# Patient Record
Sex: Female | Born: 1970 | Race: Asian | Hispanic: No | Marital: Married | Smoking: Never smoker
Health system: Southern US, Community
[De-identification: ages and names within clinical notes are randomized; demographics above are authoritative.]

## PROBLEM LIST (undated history)

## (undated) DIAGNOSIS — T7840XA Allergy, unspecified, initial encounter: Secondary | ICD-10-CM

## (undated) DIAGNOSIS — E079 Disorder of thyroid, unspecified: Secondary | ICD-10-CM

## (undated) HISTORY — DX: Disorder of thyroid, unspecified: E07.9

## (undated) HISTORY — PX: TUBAL LIGATION: SHX77

## (undated) HISTORY — DX: Allergy, unspecified, initial encounter: T78.40XA

---

## 1990-12-19 HISTORY — PX: APPENDECTOMY: SHX54

## 2014-12-29 ENCOUNTER — Ambulatory Visit (INDEPENDENT_AMBULATORY_CARE_PROVIDER_SITE_OTHER): Payer: Managed Care, Other (non HMO) | Admitting: Medical

## 2014-12-29 ENCOUNTER — Encounter: Payer: Self-pay | Admitting: Medical

## 2014-12-29 VITALS — BP 107/73 | HR 80 | Temp 98.2°F | Ht 61.5 in | Wt 114.2 lb

## 2014-12-29 DIAGNOSIS — S93402A Sprain of unspecified ligament of left ankle, initial encounter: Secondary | ICD-10-CM

## 2014-12-29 DIAGNOSIS — J069 Acute upper respiratory infection, unspecified: Secondary | ICD-10-CM

## 2014-12-29 DIAGNOSIS — L509 Urticaria, unspecified: Secondary | ICD-10-CM

## 2014-12-29 DIAGNOSIS — E039 Hypothyroidism, unspecified: Secondary | ICD-10-CM | POA: Insufficient documentation

## 2014-12-29 DIAGNOSIS — S93409A Sprain of unspecified ligament of unspecified ankle, initial encounter: Secondary | ICD-10-CM | POA: Insufficient documentation

## 2014-12-29 LAB — T4, FREE: FREE T4: 1.01 ng/dL (ref 0.60–1.60)

## 2014-12-29 LAB — T3, FREE: T3 FREE: 2.5 pg/mL (ref 2.3–4.2)

## 2014-12-29 LAB — TSH: TSH: 0.86 u[IU]/mL (ref 0.35–4.50)

## 2014-12-29 MED ORDER — FLUTICASONE PROPIONATE 50 MCG/ACT NA SUSP: 2.0000 | Freq: Every day | NASAL | Status: DC

## 2014-12-29 MED ORDER — CEFDINIR 300 MG PO CAPS: 300.0000 mg | ORAL_CAPSULE | Freq: Two times a day (BID) | ORAL | Status: DC

## 2014-12-29 NOTE — Assessment & Plan Note (Signed)
For your urticaria follow allergist recommendations.

## 2014-12-29 NOTE — Assessment & Plan Note (Signed)
  Your have recent flare of allergies vs uri.  I will prescribe flonase and advise you continue zyrtec.  Your lt ear may be early infected. I am prescribing cefdinir if you have any recurrent then start.

## 2014-12-29 NOTE — Progress Notes (Signed)
Subjective:    Patient ID: Kirsten Ramirez, female    DOB: [birth year], 44 y.o.   MRN: [id number]  HPI   I have reviewed pt PMH, PSH, FH, Social History and Surgical History  Allergies- Pt states she has had testing done and brings in records. Not associated with angiodema per allergist report.  Skin testing was negative [facility]. History of spring and summer allergies. She saw Dr. Vista Minkobert Ross. He told her to take zyrtec.   No anaphylaxis per Dr Tenny Crawoss notes..  Dr. Tenny Crawoss advised use zyrtec and if needed after one week for any urticaria she could add claritin. Also Sarna orignial lotion if needed.  Pt has history of hypothyroid. Pt last test year.   Dad maybe colon cancer. She is not sure what type.  Pt used to work [employer]), No exercise, healthy diet, no smoke, rare alcohol. Married- 2 children.  Last 3 days nasal congested, pnd, and runny nose. See ros limited symptoms and she feels a lot better.  Rt ankle pain twisted it skiing. Pt states when climbs stairs has pain. This occurred 8 days ago. Level 1/10 pain. Hardly any pain.  She states almost forgets about pain now.  No itching skin/urticaria know.  LMP- mid December.  Pt last pap smear.(last time was negative).   Last mammogram- 2 years.        Review of Systems  Constitutional: Negative for fever, chills and fatigue.  HENT: Positive for congestion, ear pain, postnasal drip and rhinorrhea. Negative for drooling, mouth sores, nosebleeds, sinus pressure, sore throat and tinnitus.        Lt ear pain only yesterday but none today.  Eyes: Negative for pain.  Respiratory: Negative for cough, chest tightness, shortness of breath and wheezing.   Cardiovascular: Negative for chest pain and palpitations.  Gastrointestinal: Negative for abdominal pain, diarrhea, constipation, blood in stool and abdominal distention.  Musculoskeletal: Negative for back pain.       Minimal faint rt ankle pain.  Now was severe pain. Now 1/10 level pain. Able to walk up stairs now with no pain.  Neurological: Negative for dizziness, tremors, seizures, syncope, facial asymmetry, speech difficulty, weakness, light-headedness, numbness and headaches.       Objective:   Physical Exam   General  Mental Status - Alert. General Appearance - Well groomed. Not in acute distress.  Skin Rashes- No Rashes.  HEENT Head- Normal. Ear Auditory Canal - Left- Normal. Right - Normal.Tympanic Membrane- Rt side Normal. Lt side- Upper 1/3 moderate bright red. Remainder looks normal. Eye Sclera/Conjunctiva- Left- Normal. Right- Normal. Nose & Sinuses Nasal Mucosa- Left-  Boggy + Congested. Right-  Boggy + Congested. Mouth & Throat Lips: Upper Lip- Normal: no dryness, cracking, pallor, cyanosis, or vesicular eruption. Lower Lip-Normal: no dryness, cracking, pallor, cyanosis or vesicular eruption. Buccal Mucosa- Bilateral- No Aphthous ulcers. Oropharynx- No Discharge or Erythema. Tonsils: Characteristics- Bilateral- No Erythema or Congestion. Size/Enlargement- Bilateral- No enlargement. Discharge- bilateral-None.  Neck Neck- Supple. No Masses.   Chest and Lung Exam Auscultation: Breath Sounds:- even and unlabored, but bilateral upper lobe rhonchi.  Cardiovascular Auscultation:Rythm- Regular, rate and rhythm. Murmurs & Other Heart Sounds:Ausculatation of the heart reveal- No Murmurs.  Lymphatic Head & Neck General Head & Neck Lymphatics: Bilateral: Description- No Localized lymphadenopathy.  Rt ankle- no swelling, no warmth, no tenderness. Demonstrated good rom. Faint tender at tip of bottom of fibula/tip.         Assessment & Plan:

## 2014-12-29 NOTE — Progress Notes (Signed)
Pre visit review using our clinic review tool, if applicable. No additional management support is needed unless otherwise documented below in the visit note. 

## 2014-12-29 NOTE — Patient Instructions (Addendum)
   Your have recent flare of allergies vs uri.  I will prescribe flonase and advise you continue zyrtec.  Your ankle pain is now 1/10. Minima/faint pain. Xray offer was declined. I would recommend ace bandage compression/wrap until pain resolved completely.(low dose ibuprofen if needed   Your lt ear may be early infected. I am prescribing cefdinir if you have any recurrent then start.  Follow up in at for physical exam. Come in fasting and make early morning appointment. Please check for the availability as you leave. I ask that you make appointment at 8:30 am or 9:00 am.  We decided to do thyroid testing today at your request.

## 2014-12-29 NOTE — Assessment & Plan Note (Addendum)
Your ankle pain is now 1/10. Minima/faint pain. Xray offer was declined. I would recommend ace bandage compression/wrap until pain resolved completely.(low dose ibuprofen if needed)

## 2014-12-29 NOTE — Assessment & Plan Note (Signed)
Tsh, t3, t4 today at pt request. She wanted more than just tsh. She will continue current dose pending the results.

## 2015-01-02 ENCOUNTER — Encounter: Payer: Self-pay | Admitting: Medical

## 2015-01-02 ENCOUNTER — Telehealth: Payer: Self-pay | Admitting: Medical

## 2015-01-02 ENCOUNTER — Ambulatory Visit (INDEPENDENT_AMBULATORY_CARE_PROVIDER_SITE_OTHER): Payer: Managed Care, Other (non HMO) | Admitting: Medical

## 2015-01-02 VITALS — BP 115/74 | HR 73 | Temp 98.4°F | Ht 61.5 in | Wt 114.0 lb

## 2015-01-02 DIAGNOSIS — Z1239 Encounter for other screening for malignant neoplasm of breast: Secondary | ICD-10-CM

## 2015-01-02 DIAGNOSIS — E039 Hypothyroidism, unspecified: Secondary | ICD-10-CM | POA: Diagnosis not present

## 2015-01-02 DIAGNOSIS — Z0000 Encounter for general adult medical examination without abnormal findings: Secondary | ICD-10-CM

## 2015-01-02 DIAGNOSIS — Z0189 Encounter for other specified special examinations: Secondary | ICD-10-CM

## 2015-01-02 LAB — CBC WITH DIFFERENTIAL/PLATELET
BASOS ABS: 0 10*3/uL (ref 0.0–0.1)
Basophils Relative: 0.5 % (ref 0.0–3.0)
Eosinophils Absolute: 0.1 10*3/uL (ref 0.0–0.7)
Eosinophils Relative: 1 % (ref 0.0–5.0)
HCT: 38.6 % (ref 36.0–46.0)
Hemoglobin: 12.8 g/dL (ref 12.0–15.0)
LYMPHS PCT: 26.5 % (ref 12.0–46.0)
Lymphs Abs: 1.9 10*3/uL (ref 0.7–4.0)
MCHC: 33.1 g/dL (ref 30.0–36.0)
MCV: 85.6 fl (ref 78.0–100.0)
MONOS PCT: 4.9 % (ref 3.0–12.0)
Monocytes Absolute: 0.3 10*3/uL (ref 0.1–1.0)
Neutro Abs: 4.8 10*3/uL (ref 1.4–7.7)
Neutrophils Relative %: 67.1 % (ref 43.0–77.0)
PLATELETS: 287 10*3/uL (ref 150.0–400.0)
RBC: 4.51 Mil/uL (ref 3.87–5.11)
RDW: 12.6 % (ref 11.5–15.5)
WBC: 7.1 10*3/uL (ref 4.0–10.5)

## 2015-01-02 LAB — COMPREHENSIVE METABOLIC PANEL
ALT: 10 U/L (ref 0–35)
AST: 14 U/L (ref 0–37)
Albumin: 4.3 g/dL (ref 3.5–5.2)
Alkaline Phosphatase: 55 U/L (ref 39–117)
BILIRUBIN TOTAL: 0.6 mg/dL (ref 0.2–1.2)
BUN: 13 mg/dL (ref 6–23)
CALCIUM: 9.2 mg/dL (ref 8.4–10.5)
CHLORIDE: 105 meq/L (ref 96–112)
CO2: 30 meq/L (ref 19–32)
Creatinine, Ser: 0.64 mg/dL (ref 0.40–1.20)
GFR: 106.51 mL/min (ref >60.00)
GLUCOSE: 97 mg/dL (ref 70–99)
Potassium: 3.8 mEq/L (ref 3.5–5.1)
Sodium: 139 mEq/L (ref 135–145)
Total Protein: 7.5 g/dL (ref 6.0–8.3)

## 2015-01-02 LAB — LIPID PANEL
CHOLESTEROL: 140 mg/dL (ref 0–200)
HDL: 41.2 mg/dL (ref >39.00)
LDL CALC: 75 mg/dL (ref 0–99)
NonHDL: 98.8
TRIGLYCERIDES: 119 mg/dL (ref 0.0–149.0)
Total CHOL/HDL Ratio: 3
VLDL: 23.8 mg/dL (ref 0.0–40.0)

## 2015-01-02 NOTE — Patient Instructions (Addendum)
Please get screening labs for physical exam today. We gave you fluvaccine today. I put in mammogram order and our staff should be call you with appointnment.  Preventive Care for Adults A healthy lifestyle and preventive care can promote health and wellness. Preventive health guidelines for women include the following key practices.  A routine yearly physical is a good way to check with your health care provider about your health and preventive screening. It is a chance to share any concerns and updates on your health and to receive a thorough exam.  Visit your dentist for a routine exam and preventive care every 6 months. Brush your teeth twice a day and floss once a day. Good oral hygiene prevents tooth decay and gum disease.  The frequency of eye exams is based on your age, health, family medical history, use of contact lenses, and other factors. Follow your health care provider's recommendations for frequency of eye exams.  Eat a healthy diet. Foods like vegetables, fruits, whole grains, low-fat dairy products, and lean protein foods contain the nutrients you need without too many calories. Decrease your intake of foods high in solid fats, added sugars, and salt. Eat the right amount of calories for you.Get information about a proper diet from your health care provider, if necessary.  Regular physical exercise is one of the most important things you can do for your health. Most adults should get at least 150 minutes of moderate-intensity exercise (any activity that increases your heart rate and causes you to sweat) each week. In addition, most adults need muscle-strengthening exercises on 2 or more days a week.  Maintain a healthy weight. The body mass index (BMI) is a screening tool to identify possible weight problems. It provides an estimate of body fat based on height and weight. Your health care provider can find your BMI and can help you achieve or maintain a healthy weight.For adults 20  years and older:  A BMI below 18.5 is considered underweight.  A BMI of 18.5 to 24.9 is normal.  A BMI of 25 to 29.9 is considered overweight.  A BMI of 30 and above is considered obese.  Maintain normal blood lipids and cholesterol levels by exercising and minimizing your intake of saturated fat. Eat a balanced diet with plenty of fruit and vegetables. Blood tests for lipids and cholesterol should begin at age 31 and be repeated every 5 years. If your lipid or cholesterol levels are high, you are over 50, or you are at high risk for heart disease, you may need your cholesterol levels checked more frequently.Ongoing high lipid and cholesterol levels should be treated with medicines if diet and exercise are not working.  If you smoke, find out from your health care provider how to quit. If you do not use tobacco, do not start.  Lung cancer screening is recommended for adults aged 79-80 years who are at high risk for developing lung cancer because of a history of smoking. A yearly low-dose CT scan of the lungs is recommended for people who have at least a 30-pack-year history of smoking and are a current smoker or have quit within the past 15 years. A pack year of smoking is smoking an average of 1 pack of cigarettes a day for 1 year (for example: 1 pack a day for 30 years or 2 packs a day for 15 years). Yearly screening should continue until the smoker has stopped smoking for at least 15 years. Yearly screening should be stopped for  people who develop a health problem that would prevent them from having lung cancer treatment.  If you are pregnant, do not drink alcohol. If you are breastfeeding, be very cautious about drinking alcohol. If you are not pregnant and choose to drink alcohol, do not have more than 1 drink per day. One drink is considered to be 12 ounces (355 mL) of beer, 5 ounces (148 mL) of wine, or 1.5 ounces (44 mL) of liquor.  Avoid use of street drugs. Do not share needles with  anyone. Ask for help if you need support or instructions about stopping the use of drugs.  High blood pressure causes heart disease and increases the risk of stroke. Your blood pressure should be checked at least every 1 to 2 years. Ongoing high blood pressure should be treated with medicines if weight loss and exercise do not work.  If you are 42-18 years old, ask your health care provider if you should take aspirin to prevent strokes.  Diabetes screening involves taking a blood sample to check your fasting blood sugar level. This should be done once every 3 years, after age 41, if you are within normal weight and without risk factors for diabetes. Testing should be considered at a younger age or be carried out more frequently if you are overweight and have at least 1 risk factor for diabetes.  Breast cancer screening is essential preventive care for women. You should practice "breast self-awareness." This means understanding the normal appearance and feel of your breasts and may include breast self-examination. Any changes detected, no matter how small, should be reported to a health care provider. Women in their 6s and 30s should have a clinical breast exam (CBE) by a health care provider as part of a regular health exam every 1 to 3 years. After age 54, women should have a CBE every year. Starting at age 55, women should consider having a mammogram (breast X-ray test) every year. Women who have a family history of breast cancer should talk to their health care provider about genetic screening. Women at a high risk of breast cancer should talk to their health care providers about having an MRI and a mammogram every year.  Breast cancer gene (BRCA)-related cancer risk assessment is recommended for women who have family members with BRCA-related cancers. BRCA-related cancers include breast, ovarian, tubal, and peritoneal cancers. Having family members with these cancers may be associated with an  increased risk for harmful changes (mutations) in the breast cancer genes BRCA1 and BRCA2. Results of the assessment will determine the need for genetic counseling and BRCA1 and BRCA2 testing.  Routine pelvic exams to screen for cancer are no longer recommended for nonpregnant women who are considered low risk for cancer of the pelvic organs (ovaries, uterus, and vagina) and who do not have symptoms. Ask your health care provider if a screening pelvic exam is right for you.  If you have had past treatment for cervical cancer or a condition that could lead to cancer, you need Pap tests and screening for cancer for at least 20 years after your treatment. If Pap tests have been discontinued, your risk factors (such as having a new sexual partner) need to be reassessed to determine if screening should be resumed. Some women have medical problems that increase the chance of getting cervical cancer. In these cases, your health care provider may recommend more frequent screening and Pap tests.  The HPV test is an additional test that may be used for  cervical cancer screening. The HPV test looks for the virus that can cause the cell changes on the cervix. The cells collected during the Pap test can be tested for HPV. The HPV test could be used to screen women aged 27 years and older, and should be used in women of any age who have unclear Pap test results. After the age of 55, women should have HPV testing at the same frequency as a Pap test.  Colorectal cancer can be detected and often prevented. Most routine colorectal cancer screening begins at the age of 79 years and continues through age 47 years. However, your health care provider may recommend screening at an earlier age if you have risk factors for colon cancer. On a yearly basis, your health care provider may provide home test kits to check for hidden blood in the stool. Use of a small camera at the end of a tube, to directly examine the colon  (sigmoidoscopy or colonoscopy), can detect the earliest forms of colorectal cancer. Talk to your health care provider about this at age 34, when routine screening begins. Direct exam of the colon should be repeated every 5-10 years through age 47 years, unless early forms of pre-cancerous polyps or small growths are found.  People who are at an increased risk for hepatitis B should be screened for this virus. You are considered at high risk for hepatitis B if:  You were born in a country where hepatitis B occurs often. Talk with your health care provider about which countries are considered high risk.  Your parents were born in a high-risk country and you have not received a shot to protect against hepatitis B (hepatitis B vaccine).  You have HIV or AIDS.  You use needles to inject street drugs.  You live with, or have sex with, someone who has hepatitis B.  You get hemodialysis treatment.  You take certain medicines for conditions like cancer, organ transplantation, and autoimmune conditions.  Hepatitis C blood testing is recommended for all people born from [birth year] through 1965 and any individual with known risks for hepatitis C.  Practice safe sex. Use condoms and avoid high-risk sexual practices to reduce the spread of sexually transmitted infections (STIs). STIs include gonorrhea, chlamydia, syphilis, trichomonas, herpes, HPV, and human immunodeficiency virus (HIV). Herpes, HIV, and HPV are viral illnesses that have no cure. They can result in disability, cancer, and death.  You should be screened for sexually transmitted illnesses (STIs) including gonorrhea and chlamydia if:  You are sexually active and are younger than 24 years.  You are older than 24 years and your health care provider tells you that you are at risk for this type of infection.  Your sexual activity has changed since you were last screened and you are at an increased risk for chlamydia or gonorrhea. Ask your health  care provider if you are at risk.  If you are at risk of being infected with HIV, it is recommended that you take a prescription medicine daily to prevent HIV infection. This is called preexposure prophylaxis (PrEP). You are considered at risk if:  You are a heterosexual woman, are sexually active, and are at increased risk for HIV infection.  You take drugs by injection.  You are sexually active with a partner who has HIV.  Talk with your health care provider about whether you are at high risk of being infected with HIV. If you choose to begin PrEP, you should first be tested for HIV. You  should then be tested every 3 months for as long as you are taking PrEP.  Osteoporosis is a disease in which the bones lose minerals and strength with aging. This can result in serious bone fractures or breaks. The risk of osteoporosis can be identified using a bone density scan. Women ages 77 years and over and women at risk for fractures or osteoporosis should discuss screening with their health care providers. Ask your health care provider whether you should take a calcium supplement or vitamin D to reduce the rate of osteoporosis.  Menopause can be associated with physical symptoms and risks. Hormone replacement therapy is available to decrease symptoms and risks. You should talk to your health care provider about whether hormone replacement therapy is right for you.  Use sunscreen. Apply sunscreen liberally and repeatedly throughout the day. You should seek shade when your shadow is shorter than you. Protect yourself by wearing long sleeves, pants, a wide-brimmed hat, and sunglasses year round, whenever you are outdoors.  Once a month, do a whole body skin exam, using a mirror to look at the skin on your back. Tell your health care provider of new moles, moles that have irregular borders, moles that are larger than a pencil eraser, or moles that have changed in shape or color.  Stay current with required  vaccines (immunizations).  Influenza vaccine. All adults should be immunized every year.  Tetanus, diphtheria, and acellular pertussis (Td, Tdap) vaccine. Pregnant women should receive 1 dose of Tdap vaccine during each pregnancy. The dose should be obtained regardless of the length of time since the last dose. Immunization is preferred during the 27th-36th week of gestation. An adult who has not previously received Tdap or who does not know her vaccine status should receive 1 dose of Tdap. This initial dose should be followed by tetanus and diphtheria toxoids (Td) booster doses every 10 years. Adults with an unknown or incomplete history of completing a 3-dose immunization series with Td-containing vaccines should begin or complete a primary immunization series including a Tdap dose. Adults should receive a Td booster every 10 years.  Varicella vaccine. An adult without evidence of immunity to varicella should receive 2 doses or a second dose if she has previously received 1 dose. Pregnant females who do not have evidence of immunity should receive the first dose after pregnancy. This first dose should be obtained before leaving the health care facility. The second dose should be obtained 4-8 weeks after the first dose.  Human papillomavirus (HPV) vaccine. Females aged 13-26 years who have not received the vaccine previously should obtain the 3-dose series. The vaccine is not recommended for use in pregnant females. However, pregnancy testing is not needed before receiving a dose. If a female is found to be pregnant after receiving a dose, no treatment is needed. In that case, the remaining doses should be delayed until after the pregnancy. Immunization is recommended for any person with an immunocompromised condition through the age of 4 years if she did not get any or all doses earlier. During the 3-dose series, the second dose should be obtained 4-8 weeks after the first dose. The third dose should be  obtained 24 weeks after the first dose and 16 weeks after the second dose.  Zoster vaccine. One dose is recommended for adults aged 73 years or older unless certain conditions are present.  Measles, mumps, and rubella (MMR) vaccine. Adults born before [birth year] generally are considered immune to measles and mumps. Adults born in  1959 or later should have 1 or more doses of MMR vaccine unless there is a contraindication to the vaccine or there is laboratory evidence of immunity to each of the three diseases. A routine second dose of MMR vaccine should be obtained at least 28 days after the first dose for students attending postsecondary schools, health care workers, or international travelers. People who received inactivated measles vaccine or an unknown type of measles vaccine during 1963-1967 should receive 2 doses of MMR vaccine. People who received inactivated mumps vaccine or an unknown type of mumps vaccine before 1979 and are at high risk for mumps infection should consider immunization with 2 doses of MMR vaccine. For females of childbearing age, rubella immunity should be determined. If there is no evidence of immunity, females who are not pregnant should be vaccinated. If there is no evidence of immunity, females who are pregnant should delay immunization until after pregnancy. Unvaccinated health care workers born before [birth year] who lack laboratory evidence of measles, mumps, or rubella immunity or laboratory confirmation of disease should consider measles and mumps immunization with 2 doses of MMR vaccine or rubella immunization with 1 dose of MMR vaccine.  Pneumococcal 13-valent conjugate (PCV13) vaccine. When indicated, a person who is uncertain of her immunization history and has no record of immunization should receive the PCV13 vaccine. An adult aged 21 years or older who has certain medical conditions and has not been previously immunized should receive 1 dose of PCV13 vaccine. This PCV13 should be  followed with a dose of pneumococcal polysaccharide (PPSV23) vaccine. The PPSV23 vaccine dose should be obtained at least 8 weeks after the dose of PCV13 vaccine. An adult aged 80 years or older who has certain medical conditions and previously received 1 or more doses of PPSV23 vaccine should receive 1 dose of PCV13. The PCV13 vaccine dose should be obtained 1 or more years after the last PPSV23 vaccine dose.  Pneumococcal polysaccharide (PPSV23) vaccine. When PCV13 is also indicated, PCV13 should be obtained first. All adults aged 46 years and older should be immunized. An adult younger than age 78 years who has certain medical conditions should be immunized. Any person who resides in a nursing home or long-term care facility should be immunized. An adult smoker should be immunized. People with an immunocompromised condition and certain other conditions should receive both PCV13 and PPSV23 vaccines. People with human immunodeficiency virus (HIV) infection should be immunized as soon as possible after diagnosis. Immunization during chemotherapy or radiation therapy should be avoided. Routine use of PPSV23 vaccine is not recommended for American Indians, [employer] Natives, or people younger than 65 years unless there are medical conditions that require PPSV23 vaccine. When indicated, people who have unknown immunization and have no record of immunization should receive PPSV23 vaccine. One-time revaccination 5 years after the first dose of PPSV23 is recommended for people aged 19-64 years who have chronic kidney failure, nephrotic syndrome, asplenia, or immunocompromised conditions. People who received 1-2 doses of PPSV23 before age 51 years should receive another dose of PPSV23 vaccine at age 75 years or later if at least 5 years have passed since the previous dose. Doses of PPSV23 are not needed for people immunized with PPSV23 at or after age 61 years.  Meningococcal vaccine. Adults with asplenia or persistent  complement component deficiencies should receive 2 doses of quadrivalent meningococcal conjugate (MenACWY-D) vaccine. The doses should be obtained at least 2 months apart. Microbiologists working with certain meningococcal bacteria, [employer] recruits, people at risk  during an outbreak, and people who travel to or live in countries with a high rate of meningitis should be immunized. A first-year college student up through age 58 years who is living in a residence hall should receive a dose if she did not receive a dose on or after her 16th birthday. Adults who have certain high-risk conditions should receive one or more doses of vaccine.  Hepatitis A vaccine. Adults who wish to be protected from this disease, have certain high-risk conditions, work with hepatitis A-infected animals, work in hepatitis A research labs, or travel to or work in countries with a high rate of hepatitis A should be immunized. Adults who were previously unvaccinated and who anticipate close contact with an international adoptee during the first 60 days after arrival in the [place] from a country with a high rate of hepatitis A should be immunized.  Hepatitis B vaccine. Adults who wish to be protected from this disease, have certain high-risk conditions, may be exposed to blood or other infectious body fluids, are household contacts or sex partners of hepatitis B positive people, are clients or workers in certain care facilities, or travel to or work in countries with a high rate of hepatitis B should be immunized.  Haemophilus influenzae type b (Hib) vaccine. A previously unvaccinated person with asplenia or sickle cell disease or having a scheduled splenectomy should receive 1 dose of Hib vaccine. Regardless of previous immunization, a recipient of a hematopoietic stem cell transplant should receive a 3-dose series 6-12 months after her successful transplant. Hib vaccine is not recommended for adults with HIV  infection. Preventive Services / Frequency Ages 68 to 27 years  Blood pressure check.** / Every 1 to 2 years.  Lipid and cholesterol check.** / Every 5 years beginning at age 26.  Clinical breast exam.** / Every 3 years for women in their 49s and 21s.  BRCA-related cancer risk assessment.** / For women who have family members with a BRCA-related cancer (breast, ovarian, tubal, or peritoneal cancers).  Pap test.** / Every 2 years from ages 7 through 20. Every 3 years starting at age 1 through age 88 or 31 with a history of 3 consecutive normal Pap tests.  HPV screening.** / Every 3 years from ages 39 through ages 49 to 33 with a history of 3 consecutive normal Pap tests.  Hepatitis C blood test.** / For any individual with known risks for hepatitis C.  Skin self-exam. / Monthly.  Influenza vaccine. / Every year.  Tetanus, diphtheria, and acellular pertussis (Tdap, Td) vaccine.** / Consult your health care provider. Pregnant women should receive 1 dose of Tdap vaccine during each pregnancy. 1 dose of Td every 10 years.  Varicella vaccine.** / Consult your health care provider. Pregnant females who do not have evidence of immunity should receive the first dose after pregnancy.  HPV vaccine. / 3 doses over 6 months, if 60 and younger. The vaccine is not recommended for use in pregnant females. However, pregnancy testing is not needed before receiving a dose.  Measles, mumps, rubella (MMR) vaccine.** / You need at least 1 dose of MMR if you were born in 1957 or later. You may also need a 2nd dose. For females of childbearing age, rubella immunity should be determined. If there is no evidence of immunity, females who are not pregnant should be vaccinated. If there is no evidence of immunity, females who are pregnant should delay immunization until after pregnancy.  Pneumococcal 13-valent conjugate (PCV13) vaccine.** /  Consult your health care provider.  Pneumococcal polysaccharide  (PPSV23) vaccine.** / 1 to 2 doses if you smoke cigarettes or if you have certain conditions.  Meningococcal vaccine.** / 1 dose if you are age 85 to 57 years and a Market researcher living in a residence hall, or have one of several medical conditions, you need to get vaccinated against meningococcal disease. You may also need additional booster doses.  Hepatitis A vaccine.** / Consult your health care provider.  Hepatitis B vaccine.** / Consult your health care provider.  Haemophilus influenzae type b (Hib) vaccine.** / Consult your health care provider. Ages 22 to 33 years  Blood pressure check.** / Every 1 to 2 years.  Lipid and cholesterol check.** / Every 5 years beginning at age 47 years.  Lung cancer screening. / Every year if you are aged 27-80 years and have a 30-pack-year history of smoking and currently smoke or have quit within the past 15 years. Yearly screening is stopped once you have quit smoking for at least 15 years or develop a health problem that would prevent you from having lung cancer treatment.  Clinical breast exam.** / Every year after age 60 years.  BRCA-related cancer risk assessment.** / For women who have family members with a BRCA-related cancer (breast, ovarian, tubal, or peritoneal cancers).  Mammogram.** / Every year beginning at age 41 years and continuing for as long as you are in good health. Consult with your health care provider.  Pap test.** / Every 3 years starting at age 24 years through age 71 or 27 years with a history of 3 consecutive normal Pap tests.  HPV screening.** / Every 3 years from ages 4 years through ages 85 to 59 years with a history of 3 consecutive normal Pap tests.  Fecal occult blood test (FOBT) of stool. / Every year beginning at age 13 years and continuing until age 47 years. You may not need to do this test if you get a colonoscopy every 10 years.  Flexible sigmoidoscopy or colonoscopy.** / Every 5 years for a  flexible sigmoidoscopy or every 10 years for a colonoscopy beginning at age 3 years and continuing until age 38 years.  Hepatitis C blood test.** / For all people born from [birth year] through 1965 and any individual with known risks for hepatitis C.  Skin self-exam. / Monthly.  Influenza vaccine. / Every year.  Tetanus, diphtheria, and acellular pertussis (Tdap/Td) vaccine.** / Consult your health care provider. Pregnant women should receive 1 dose of Tdap vaccine during each pregnancy. 1 dose of Td every 10 years.  Varicella vaccine.** / Consult your health care provider. Pregnant females who do not have evidence of immunity should receive the first dose after pregnancy.  Zoster vaccine.** / 1 dose for adults aged 22 years or older.  Measles, mumps, rubella (MMR) vaccine.** / You need at least 1 dose of MMR if you were born in 1957 or later. You may also need a 2nd dose. For females of childbearing age, rubella immunity should be determined. If there is no evidence of immunity, females who are not pregnant should be vaccinated. If there is no evidence of immunity, females who are pregnant should delay immunization until after pregnancy.  Pneumococcal 13-valent conjugate (PCV13) vaccine.** / Consult your health care provider.  Pneumococcal polysaccharide (PPSV23) vaccine.** / 1 to 2 doses if you smoke cigarettes or if you have certain conditions.  Meningococcal vaccine.** / Consult your health care provider.  Hepatitis A vaccine.** / Consult  your health care provider.  Hepatitis B vaccine.** / Consult your health care provider.  Haemophilus influenzae type b (Hib) vaccine.** / Consult your health care provider. Ages 76 years and over  Blood pressure check.** / Every 1 to 2 years.  Lipid and cholesterol check.** / Every 5 years beginning at age 62 years.  Lung cancer screening. / Every year if you are aged 34-80 years and have a 30-pack-year history of smoking and currently smoke or have  quit within the past 15 years. Yearly screening is stopped once you have quit smoking for at least 15 years or develop a health problem that would prevent you from having lung cancer treatment.  Clinical breast exam.** / Every year after age 12 years.  BRCA-related cancer risk assessment.** / For women who have family members with a BRCA-related cancer (breast, ovarian, tubal, or peritoneal cancers).  Mammogram.** / Every year beginning at age 65 years and continuing for as long as you are in good health. Consult with your health care provider.  Pap test.** / Every 3 years starting at age 75 years through age 39 or 25 years with 3 consecutive normal Pap tests. Testing can be stopped between 65 and 70 years with 3 consecutive normal Pap tests and no abnormal Pap or HPV tests in the past 10 years.  HPV screening.** / Every 3 years from ages 12 years through ages 52 or 87 years with a history of 3 consecutive normal Pap tests. Testing can be stopped between 65 and 70 years with 3 consecutive normal Pap tests and no abnormal Pap or HPV tests in the past 10 years.  Fecal occult blood test (FOBT) of stool. / Every year beginning at age 93 years and continuing until age 49 years. You may not need to do this test if you get a colonoscopy every 10 years.  Flexible sigmoidoscopy or colonoscopy.** / Every 5 years for a flexible sigmoidoscopy or every 10 years for a colonoscopy beginning at age 70 years and continuing until age 4 years.  Hepatitis C blood test.** / For all people born from [birth year] through 1967 and any individual with known risks for hepatitis C.  Osteoporosis screening.** / A one-time screening for women ages 10 years and over and women at risk for fractures or osteoporosis.  Skin self-exam. / Monthly.  Influenza vaccine. / Every year.  Tetanus, diphtheria, and acellular pertussis (Tdap/Td) vaccine.** / 1 dose of Td every 10 years.  Varicella vaccine.** / Consult your health care  provider.  Zoster vaccine.** / 1 dose for adults aged 38 years or older.  Pneumococcal 13-valent conjugate (PCV13) vaccine.** / Consult your health care provider.  Pneumococcal polysaccharide (PPSV23) vaccine.** / 1 dose for all adults aged 44 years and older.  Meningococcal vaccine.** / Consult your health care provider.  Hepatitis A vaccine.** / Consult your health care provider.  Hepatitis B vaccine.** / Consult your health care provider.  Haemophilus influenzae type b (Hib) vaccine.** / Consult your health care provider. ** Family history and personal history of risk and conditions may change your health care provider's recommendations. Document Released: 01/31/2002 Document Revised: 04/21/2014 Document Reviewed: 05/02/2011 [facility] Patient Information 2015 [place], [place]. This information is not intended to replace advice given to you by your health care provider. Make sure you discuss any questions you have with your health care provider.

## 2015-01-02 NOTE — Progress Notes (Signed)
Subjective:    Patient ID: Kirsten Ramirez, female    DOB: 29-Jul-1971, 44 y.o.   MRN: [id number]  HPI   Pt in for physical. She had papsmear January of last year. Normal pap.   Pt had mammogram either Jan 15th, 2014 and she thinks was normal. [facility]  Pt does not exercise, healthy diet, no smoker, rare alchohol. Married 2 children.  No flu vaccine this year.  Pt initially not sure when her last Tdap was? We don't have all her records yet. Pt called office and only got tetanus. Jan, 4 2014.  No acute problems today. Or flare of chronic problems.  Lmp- currently.  Past Medical History  Diagnosis Date  . Allergy   . Thyroid disease     History   Social History  . Marital Status: Married    Spouse Name: N/A    Number of Children: N/A  . Years of Education: N/A   Occupational History  . Not on file.   Social History Main Topics  . Smoking status: Never Smoker   . Smokeless tobacco: Never Used  . Alcohol Use: 0.0 oz/week    0 Not specified per week     Comment: only very rare and very little around holidays.  . Drug Use: No  . Sexual Activity: Yes   Other Topics Concern  . Not on file   Social History Narrative    Past Surgical History  Procedure Laterality Date  . Appendectomy    . Cesarean section      x 2    Family History  Problem Relation Age of Onset  . Hypertension Mother   . Hypertension Father   . Cancer Father     No Known Allergies  Current Outpatient Prescriptions on File Prior to Visit  Medication Sig Dispense Refill  . fluticasone (FLONASE) 50 MCG/ACT nasal spray Place 2 sprays into both nostrils daily. 16 g 1  . levothyroxine (SYNTHROID, LEVOTHROID) 50 MCG tablet Take 50 mcg by mouth daily before breakfast.    . cefdinir (OMNICEF) 300 MG capsule Take 1 capsule (300 mg total) by mouth 2 (two) times daily. (Patient not taking: Reported on 01/02/2015) 20 capsule 0   No current  facility-administered medications on file prior to visit.    BP 115/74 mmHg  Pulse 73  Temp(Src) 98.4 F (36.9 C) (Oral)  Ht 5' 1.5" (1.562 m)  Wt 114 lb (51.71 kg)  BMI 21.19 kg/m2  SpO2 100%  LMP 12/10/2014      Review of Systems  Constitutional: Negative for fever, chills and fatigue.  HENT: Positive for congestion. Negative for ear discharge, ear pain, nosebleeds, postnasal drip, rhinorrhea, sinus pressure, sore throat and trouble swallowing.        Faint nasal congestion only.  Respiratory: Negative for cough, chest tightness, shortness of breath and wheezing.   Cardiovascular: Negative for chest pain and palpitations.  Gastrointestinal: Negative for nausea, vomiting, abdominal pain, diarrhea and constipation.  Genitourinary: Negative for dysuria and flank pain.  Musculoskeletal: Negative for back pain.  Neurological: Negative for dizziness, tremors, seizures, syncope, weakness, light-headedness, numbness and headaches.  Hematological: Negative for adenopathy. Does not bruise/bleed easily.  Psychiatric/Behavioral: Negative for suicidal ideas, behavioral problems and dysphoric mood. The patient is not nervous/anxious.        Objective:   Physical Exam   General   Mental Status- Alert.  Orientation-Oriented x3. Build and Nutrition Well Nourished and Well Developed.  Skin General:  Normal.  Color- Normal color. Moisture- Dry.Temperature warm. Lesions: No suspicious lesions  Head, Eyes, Ears, Nose, Thoat Ears-Normal. Auditory Canal-Bilateral-Normal. Tympanic Membrane- Bilateral-Normal. Eyes Fundi- Bilateral-Normal. Pupil- Bilateral- Direct reaction to light normal. Nose & Sinuses- Normal. Nostril- Bilateral-Normal.  Neck Neck- No Bruits or Masses. Thyroid- Normal. No thyromegaly or nodules.  Breast Breast Lump: No palpable masses, symmetric, no axillary lymphadenopathy palpated.  Chest and Lung Exam  Percussion: Quality and Intensity:-Percussion normal.  Percussion of chest reveals- No Dullness. Palpation of the chest reveals- Non-tender. Auscultation: Breath sounds-Normal. Adventitious  Sounds:No adventitious   Vaginal Pap neg last year. Currently menstruating. Exam not done.   Cardiovascular Inspection: No Heaves. Auscultation: Heart Sounds- Normal sinus rhythm without murmur or gallop, S1 WNL and S2 WNL.  Abdomen Inspection:- Inspection Normal. Inspection of abdomen reveals- No Hernias. Palpation/Percussion: Palpation and Percussion of the abdomen reveal- Non Tender and No Palpable masses. Liver: Other Characteristics- No Hepatmegaly Spleen:Other Characteristics- No Splenomegaly. Auscultation: Auscultation of the abdomen reveals-Bowel sounds normal and No Abdominal bruits.   Neurologic Mental Status- Normal Cranial Nerves- Normal Bilaterally, Motor- Normal. Strength:5/5 normal muscle strength- All Muscles. General Assessment of Reflexes- Right Knee- 2+. Left Knee- 2+. Coordination- Normal. Gait- Normal. Meningeal Signs- None.  Musculoskeletal Global Assessment General- Joints show full range of motion without obvious deformity and Normal muscle mass. Strength 5/5 in upper and lower extremities.  Lymphatic General lymphatics Description-No Generalized lymphadenopathy.           Assessment & Plan:  Pt after everything done on exam and when she was outside lab she asks me what vaccinations she needs for [place] travel. I had to get back with other patients. I advised her to contact health dept travel clinic. Give them location of travel and they can advise her.

## 2015-01-02 NOTE — Telephone Encounter (Signed)
Caller name: Sabrina Relation to pt: self Call back number:  [phone] Pharmacy:  Reason for call:   Patient states that the provider that she switched from usually did a urine test with the cpe labs and wanted to know why a urine test wasn't ordered.

## 2015-01-02 NOTE — Assessment & Plan Note (Signed)
Fasting cbc, cmp, tsh, lipid panel today. Fluvaccine given.

## 2015-01-02 NOTE — Progress Notes (Signed)
Pre visit review using our clinic review tool, if applicable. No additional management support is needed unless otherwise documented below in the visit note. 

## 2015-01-05 NOTE — Telephone Encounter (Signed)
Not to lpn. Regarding ua on pe.

## 2015-01-06 NOTE — Telephone Encounter (Signed)
She can actually just have a lab appt.. I tried to call but there was no answer or voice mail

## 2015-01-07 ENCOUNTER — Other Ambulatory Visit: Payer: Self-pay

## 2015-01-07 ENCOUNTER — Telehealth: Payer: Self-pay | Admitting: Medical

## 2015-01-07 MED ORDER — LEVOTHYROXINE SODIUM 50 MCG PO TABS: 50.0000 ug | ORAL_TABLET | Freq: Every day | ORAL | Status: DC

## 2015-01-07 NOTE — Telephone Encounter (Signed)
Refilled medication 6 month supply only.

## 2015-01-07 NOTE — Telephone Encounter (Signed)
Caller name: Freada Relation to pt: self Call back number: [phone] [facility]  Reason for call:   Patient requesting levothyroxine refill to be sent. She is requesting a years worth.

## 2015-01-07 NOTE — Telephone Encounter (Signed)
Orders entered.  [facility]

## 2015-01-07 NOTE — Telephone Encounter (Signed)
Lab appointment scheduled for 01/08/15. Please order.

## 2015-01-08 ENCOUNTER — Other Ambulatory Visit (INDEPENDENT_AMBULATORY_CARE_PROVIDER_SITE_OTHER): Payer: Managed Care, Other (non HMO)

## 2015-01-08 ENCOUNTER — Telehealth: Payer: Self-pay | Admitting: Medical

## 2015-01-08 DIAGNOSIS — R319 Hematuria, unspecified: Secondary | ICD-10-CM

## 2015-01-08 DIAGNOSIS — Z0000 Encounter for general adult medical examination without abnormal findings: Secondary | ICD-10-CM

## 2015-01-08 DIAGNOSIS — Z0189 Encounter for other specified special examinations: Secondary | ICD-10-CM

## 2015-01-08 LAB — POCT URINALYSIS DIPSTICK
Bilirubin, UA: NEGATIVE
Glucose, UA: NEGATIVE
Ketones, UA: NEGATIVE
Leukocytes, UA: NEGATIVE
NITRITE UA: NEGATIVE
PH UA: 6
Protein, UA: NEGATIVE
SPEC GRAV UA: 1.02
Urobilinogen, UA: 0.2

## 2015-01-08 NOTE — Telephone Encounter (Signed)
Caller name:Janal Gikas Relationship to patient:self Can be reached: Pharmacy:  Reason for call:returning call

## 2015-01-08 NOTE — Telephone Encounter (Signed)
Patient returning phone call. Best # [phone]

## 2015-01-08 NOTE — Telephone Encounter (Signed)
Patient called back regarding urine and labs. Will come in on Monday to repeat urine only. Wants to call insurance to see if it will be covered.

## 2015-01-08 NOTE — Telephone Encounter (Signed)
Patient came in today for U/A. To be added to wellness exam.

## 2015-01-08 NOTE — Addendum Note (Signed)
Addended by: Verdie ShireBAYNES, Kenyatta Gloeckner M on: 01/08/2015 10:02 AM   Modules accepted: Orders

## 2015-01-09 LAB — URINE CULTURE
Colony Count: NO GROWTH
Organism ID, Bacteria: NO GROWTH

## 2015-01-13 ENCOUNTER — Telehealth: Payer: Self-pay | Admitting: *Deleted

## 2015-01-13 NOTE — Telephone Encounter (Signed)
[facility] JG//CMA

## 2015-02-13 ENCOUNTER — Telehealth: Payer: Self-pay | Admitting: Medical

## 2015-02-13 NOTE — Telephone Encounter (Signed)
PATIENT IS SCHEDULED FOR MAMMOGRAM ON 3-3  @1130  AT [facility] AND PATIENT IS AWARE

## 2015-02-13 NOTE — Telephone Encounter (Signed)
Can you please look into the referral for mammogram- PT states was told by Ramon DredgeEdward at her January appointment she needed to have one.

## 2015-03-12 ENCOUNTER — Telehealth: Payer: Self-pay | Admitting: Medical

## 2015-03-12 NOTE — Telephone Encounter (Signed)
Caller name: Cortez Relation to pt: self Call back number: [phone] Pharmacy:  Reason for call:   Patient states that DOS 12/29/14 and 01/02/15 were coded wrong. Patient went in for a cpe and was not coded as a wellness. Labs were coded as diagnostic not wellness.

## 2015-03-13 NOTE — Telephone Encounter (Signed)
Kirsten Ramirez,  This patient is saying her visits were coded wrong. I don't understand her complaint to be honest. She came on the 10711 th with some  acute complaints. On 1st visit I rarely do physical/wellness exam(If they expect one I tell them why I do not do so). I only do those if #1 no pmh of any sort, very young and without any acute complaint. On review she had various complaints on the 11th and I am sure I did not tell her this was wellness exam. She wanted her thryoid test done. So I did order that on 11th.   Then she came back later on 15th for physical/ wellness.(if I remember correctly she ws leaving out of country and wanted it done)   Pt sometimes thingkthey can be seen in first visit  and get wellness on first visit. Or maybe they  get that impression. Anyhow I don't know how to address this and I don't like to talk billing/finances with patient.   I would want to make sure that on 15th when I did see her for wellness that I coded correctly. And maybe you get coder to make sure that is the case. i believe I would have charged her established preventative type visit for her age and put in all labs under that diagnosis or screening diagnosis.

## 2015-03-24 ENCOUNTER — Encounter: Payer: Self-pay | Admitting: Family Medicine

## 2015-04-27 ENCOUNTER — Encounter: Payer: Self-pay | Admitting: Family

## 2015-04-27 ENCOUNTER — Ambulatory Visit (INDEPENDENT_AMBULATORY_CARE_PROVIDER_SITE_OTHER): Payer: Managed Care, Other (non HMO) | Admitting: Family

## 2015-04-27 VITALS — BP 100/64 | HR 70 | Temp 98.3°F | Resp 16 | Ht 61.5 in | Wt 112.2 lb

## 2015-04-27 DIAGNOSIS — J020 Streptococcal pharyngitis: Secondary | ICD-10-CM

## 2015-04-27 DIAGNOSIS — J029 Acute pharyngitis, unspecified: Secondary | ICD-10-CM | POA: Diagnosis not present

## 2015-04-27 DIAGNOSIS — N926 Irregular menstruation, unspecified: Secondary | ICD-10-CM

## 2015-04-27 LAB — POCT RAPID STREP A: Rapid Strep: POSITIVE

## 2015-04-27 MED ORDER — AMOXICILLIN 500 MG PO CAPS: 500.0000 mg | ORAL_CAPSULE | Freq: Three times a day (TID) | ORAL | Status: DC

## 2015-04-27 NOTE — Progress Notes (Signed)
   Subjective:    Patient ID: Kirsten Ramirez, female    DOB: 07-31-1971, 44 y.o.   MRN: [id number]  HPI  Ms. Kirsten Ramirez is a 44 yr old female who presents today with complaint of sore throat. Symptoms started 1 week. Denies associated fever. + associated cough.  Notes that her left eyelid was swollen yesterday. Reports some mild right ear pain. Throat pain is worse on the left.    Review of Systems    see HPI  Past Medical History  Diagnosis Date  . Allergy   . Thyroid disease     History   Social History  . Marital Status: Married    Spouse Name: N/A  . Number of Children: N/A  . Years of Education: N/A   Occupational History  . Not on file.   Social History Main Topics  . Smoking status: Never Smoker   . Smokeless tobacco: Never Used  . Alcohol Use: 0.0 oz/week    0 Standard drinks or equivalent per week     Comment: only very rare and very little around holidays.  . Drug Use: No  . Sexual Activity: Yes   Other Topics Concern  . Not on file   Social History Narrative    Past Surgical History  Procedure Laterality Date  . Appendectomy    . Cesarean section      x 2    Family History  Problem Relation Age of Onset  . Hypertension Mother   . Hypertension Father   . Cancer Father     No Known Allergies  Current Outpatient Prescriptions on File Prior to Visit  Medication Sig Dispense Refill  . fluticasone (FLONASE) 50 MCG/ACT nasal spray Place 2 sprays into both nostrils daily. 16 g 1  . levothyroxine (SYNTHROID, LEVOTHROID) 50 MCG tablet Take 1 tablet (50 mcg total) by mouth daily before breakfast. 180 tablet 0   No current facility-administered medications on file prior to visit.    BP 100/64 mmHg  Pulse 70  Temp(Src) 98.3 F (36.8 C) (Oral)  Resp 16  Ht 5' 1.5" (1.562 m)  Wt 112 lb 3.2 oz (50.894 kg)  BMI 20.86 kg/m2  SpO2 99%  LMP 04/10/2015    Objective:   Physical Exam  Constitutional: She is oriented to person, place, and time. She appears  well-developed and well-nourished. No distress.  HENT:  Head: Normocephalic and atraumatic.  Right Ear: Tympanic membrane and ear canal normal.  Left Ear: Tympanic membrane and ear canal normal.  Mouth/Throat: Posterior oropharyngeal erythema present. No oropharyngeal exudate or posterior oropharyngeal edema.  No eyelid swelling noted today  Cardiovascular: Normal rate and regular rhythm.   No murmur heard. Pulmonary/Chest: Effort normal and breath sounds normal. No respiratory distress. She has no wheezes. She has no rales. She exhibits no tenderness.  Lymphadenopathy:    She has no cervical adenopathy.  Neurological: She is alert and oriented to person, place, and time.  Psychiatric: She has a normal mood and affect. Her behavior is normal. Judgment and thought content normal.          Assessment & Plan:

## 2015-04-27 NOTE — Patient Instructions (Signed)
Start amoxicillin for strep throat. You will be contacted about your referral to OB/GYN.    Strep Throat Strep throat is an infection of the throat caused by a bacteria named Streptococcus pyogenes. Your health care provider may call the infection streptococcal "tonsillitis" or "pharyngitis" depending on whether there are signs of inflammation in the tonsils or back of the throat. Strep throat is most common in children aged 46-15 years during the cold months of the year, but it can occur in people of any age during any season. This infection is spread from person to person (contagious) through coughing, sneezing, or other close contact. SIGNS AND SYMPTOMS   Fever or chills.  Painful, swollen, red tonsils or throat.  Pain or difficulty when swallowing.  White or yellow spots on the tonsils or throat.  Swollen, tender lymph nodes or "glands" of the neck or under the jaw.  Red rash all over the body (rare). DIAGNOSIS  Many different infections can cause the same symptoms. A test must be done to confirm the diagnosis so the right treatment can be given. A "rapid strep test" can help your health care provider make the diagnosis in a few minutes. If this test is not available, a light swab of the infected area can be used for a throat culture test. If a throat culture test is done, results are usually available in a day or two. TREATMENT  Strep throat is treated with antibiotic medicine. HOME CARE INSTRUCTIONS   Gargle with 1 tsp of salt in 1 cup of warm water, 3-4 times per day or as needed for comfort.  Family members who also have a sore throat or fever should be tested for strep throat and treated with antibiotics if they have the strep infection.  Make sure everyone in your household washes their hands well.  Do not share food, drinking cups, or personal items that could cause the infection to spread to others.  You may need to eat a soft food diet until your sore throat gets  better.  Drink enough water and fluids to keep your urine clear or pale yellow. This will help prevent dehydration.  Get plenty of rest.  Stay home from school, day care, or work until you have been on antibiotics for 24 hours.  Take medicines only as directed by your health care provider.  Take your antibiotic medicine as directed by your health care provider. Finish it even if you start to feel better. SEEK MEDICAL CARE IF:   The glands in your neck continue to enlarge.  You develop a rash, cough, or earache.  You cough up green, yellow-brown, or bloody sputum.  You have pain or discomfort not controlled by medicines.  Your problems seem to be getting worse rather than better.  You have a fever. SEEK IMMEDIATE MEDICAL CARE IF:   You develop any new symptoms such as vomiting, severe headache, stiff or painful neck, chest pain, shortness of breath, or trouble swallowing.  You develop severe throat pain, drooling, or changes in your voice.  You develop swelling of the neck, or the skin on the neck becomes red and tender.  You develop signs of dehydration, such as fatigue, dry mouth, and decreased urination.  You become increasingly sleepy, or you cannot wake up completely. MAKE SURE YOU:  Understand these instructions.  Will watch your condition.  Will get help right away if you are not doing well or get worse. Document Released: 12/02/2000 Document Revised: 04/21/2014 Document Reviewed: 02/03/2011 [employer]  Patient Information 2015 [employer]. This information is not intended to replace advice given to you by your health care provider. Make sure you discuss any questions you have with your health care provider.  

## 2015-04-27 NOTE — Progress Notes (Signed)
Pre visit review using our clinic review tool, if applicable. No additional management support is needed unless otherwise documented below in the visit note. 

## 2015-04-28 DIAGNOSIS — J020 Streptococcal pharyngitis: Secondary | ICD-10-CM | POA: Insufficient documentation

## 2015-04-28 NOTE — Assessment & Plan Note (Signed)
Rapid strep is +.  Will rx with amoxicillin.  

## 2015-04-30 ENCOUNTER — Encounter: Payer: Self-pay | Admitting: Medical

## 2015-05-20 ENCOUNTER — Encounter: Payer: Self-pay | Admitting: Physician Assistant

## 2015-05-20 ENCOUNTER — Ambulatory Visit (INDEPENDENT_AMBULATORY_CARE_PROVIDER_SITE_OTHER): Payer: Managed Care, Other (non HMO) | Admitting: Physician Assistant

## 2015-05-20 ENCOUNTER — Telehealth: Payer: Self-pay | Admitting: Medical

## 2015-05-20 VITALS — BP 100/66 | HR 64 | Temp 98.3°F | Ht 61.5 in | Wt 113.8 lb

## 2015-05-20 DIAGNOSIS — J020 Streptococcal pharyngitis: Secondary | ICD-10-CM

## 2015-05-20 LAB — POCT RAPID STREP A (OFFICE): Rapid Strep A Screen: POSITIVE — AB

## 2015-05-20 MED ORDER — AMOXICILLIN 500 MG PO CAPS: 500.0000 mg | ORAL_CAPSULE | Freq: Three times a day (TID) | ORAL | Status: DC

## 2015-05-20 NOTE — Patient Instructions (Addendum)
Please stay well hydrated and get plenty of rest.   Start some saline nasal spray to clear out sinuses and cut down on post-nasal drip. Place a humidifier in the bedroom. Alternate tylenol and ibuprofen if needed for throat pain. Salt-water gargles will also be beneficial. Please take Amoxicillin as directed with food. If this becomes a recurrent issue, we will want to set you up with an ENT specialist.

## 2015-05-20 NOTE — Assessment & Plan Note (Signed)
Rapid strep positive. Will begin Amoxicillin TID. Supportive measures reviewed.  Recommend she monitor children for symptoms. If this does not resolve or occurs again, she will need assessment by ENT.

## 2015-05-20 NOTE — Telephone Encounter (Signed)
[facility] Laury Axon[facility]ne. She is requesting a female provider. Is this ok? Thanks!

## 2015-05-20 NOTE — Progress Notes (Signed)
Pre visit review using our clinic review tool, if applicable. No additional management support is needed unless otherwise documented below in the visit note. 

## 2015-05-20 NOTE — Progress Notes (Signed)
Patient presents to clinic today c/o 2 days of sore throat worse on L side. Endorses odynophagia but denies difficulty breathing. Denies fever, chills or cough.  Endorses PND worse when lying down. Was diagnosed and treated for strep throat 1 month ago with Amoxicillin.  Endorses complete resolution of symptoms at that time.  Past Medical History  Diagnosis Date  . Allergy   . Thyroid disease     Current Outpatient Prescriptions on File Prior to Visit  Medication Sig Dispense Refill  . levothyroxine (SYNTHROID, LEVOTHROID) 50 MCG tablet Take 1 tablet (50 mcg total) by mouth daily before breakfast. 180 tablet 0  . fluticasone (FLONASE) 50 MCG/ACT nasal spray Place 2 sprays into both nostrils daily. (Patient not taking: Reported on 05/20/2015) 16 g 1   No current facility-administered medications on file prior to visit.    No Known Allergies  Family History  Problem Relation Age of Onset  . Hypertension Mother   . Hypertension Father   . Cancer Father     History   Social History  . Marital Status: Married    Spouse Name: N/A  . Number of Children: N/A  . Years of Education: N/A   Social History Main Topics  . Smoking status: Never Smoker   . Smokeless tobacco: Never Used  . Alcohol Use: 0.0 oz/week    0 Standard drinks or equivalent per week     Comment: only very rare and very little around holidays.  . Drug Use: No  . Sexual Activity: Yes   Other Topics Concern  . None   Social History Narrative   Review of Systems - See HPI.  All other ROS are negative.  BP 100/66 mmHg  Pulse 64  Temp(Src) 98.3 F (36.8 C) (Oral)  Ht 5' 1.5" (1.562 m)  Wt 113 lb 12.8 oz (51.619 kg)  BMI 21.16 kg/m2  SpO2 100%  LMP 04/28/2015  Physical Exam  Constitutional: She is oriented to person, place, and time and well-developed, well-nourished, and in no distress.  HENT:  Head: Normocephalic and atraumatic.  Right Ear: Tympanic membrane, external ear and ear canal normal.    Left Ear: Tympanic membrane, external ear and ear canal normal.  Nose: Mucosal edema present. Right sinus exhibits no maxillary sinus tenderness and no frontal sinus tenderness. Left sinus exhibits no maxillary sinus tenderness and no frontal sinus tenderness.  Mouth/Throat: Uvula is midline, oropharynx is clear and moist and mucous membranes are normal. No oropharyngeal exudate, posterior oropharyngeal edema, posterior oropharyngeal erythema or tonsillar abscesses.  Eyes: Conjunctivae are normal. Pupils are equal, round, and reactive to light.  Neck: Neck supple.  Cardiovascular: Normal rate, regular rhythm, normal heart sounds and intact distal pulses.   Pulmonary/Chest: Effort normal and breath sounds normal. No respiratory distress. She has no wheezes. She has no rales. She exhibits no tenderness.  Lymphadenopathy:    She has no cervical adenopathy.  Neurological: She is alert and oriented to person, place, and time.  Skin: Skin is warm and dry. No rash noted.  Vitals reviewed.  Recent Results (from the past 2160 hour(s))  POCT Rapid Strep A     Status: None   Collection Time: 04/27/15 11:06 AM  Result Value Ref Range   Rapid Strep positive   POCT rapid strep A     Status: Abnormal   Collection Time: 05/20/15  6:39 PM  Result Value Ref Range   Rapid Strep A Screen Positive (A) Negative   Assessment/Plan: Streptococcal sore throat  Rapid strep positive. Will begin Amoxicillin TID. Supportive measures reviewed.  Recommend she monitor children for symptoms. If this does not resolve or occurs again, she will need assessment by ENT.

## 2015-05-21 NOTE — Telephone Encounter (Signed)
Appointment scheduled for 07/16/15.

## 2015-05-21 NOTE — Telephone Encounter (Signed)
Fine with me

## 2015-06-16 ENCOUNTER — Telehealth: Payer: Self-pay | Admitting: Medical

## 2015-06-16 NOTE — Telephone Encounter (Signed)
Caller name: Hagar Relation to pt: self Call back number: [phone] or [phone] [facility]acy[facility]  Reason for call:   Requesting an rx for melatonin 5mg (250 count), schiff(this is for her knees, triple strength and ultra strength). She is requesting this so she can pay with her health savings card. Also, requesting thermal care heat pad for back, 6 or 9 count.

## 2015-06-17 ENCOUNTER — Other Ambulatory Visit: Payer: Self-pay

## 2015-06-17 MED ORDER — MELATONIN 5 MG PO TABS: 5.0000 mg | ORAL_TABLET | Freq: Two times a day (BID) | ORAL | Status: DC

## 2015-06-17 MED ORDER — GLUCOSAMINE-CHONDROITIN 500-400 MG PO TABS: 1.0000 | ORAL_TABLET | Freq: Two times a day (BID) | ORAL | Status: DC | PRN

## 2015-06-17 NOTE — Telephone Encounter (Signed)
Called patient back left message for call back with clarification.

## 2015-06-17 NOTE — Telephone Encounter (Signed)
Rx's printed Melatonin,Glucosamine, Heating pads will have to be written

## 2015-06-17 NOTE — Telephone Encounter (Signed)
Question on otc meds pt wants rx'd.

## 2015-06-18 ENCOUNTER — Encounter: Payer: Self-pay | Admitting: Medical

## 2015-06-18 NOTE — Telephone Encounter (Signed)
Rx of melatonin, glucosamine, and thermacare patches at pt request. Wrote rx as she has been using.

## 2015-06-18 NOTE — Progress Notes (Unsigned)
Called patient to inform her Rx (3) left at front desk for her.

## 2015-07-15 ENCOUNTER — Telehealth: Payer: Self-pay | Admitting: *Deleted

## 2015-07-15 NOTE — Telephone Encounter (Signed)
Unable to reach patient at time of Pre-Visit Call.  Left message for patient to return call when available.    

## 2015-07-16 ENCOUNTER — Ambulatory Visit: Payer: Managed Care, Other (non HMO) | Admitting: Family Medicine

## 2015-09-23 ENCOUNTER — Encounter: Payer: Self-pay | Admitting: Behavioral Health

## 2015-09-23 ENCOUNTER — Telehealth: Payer: Self-pay | Admitting: Behavioral Health

## 2015-09-23 NOTE — Telephone Encounter (Signed)
Pre-Visit Call completed with patient and chart updated.   Pre-Visit Info documented in Specialty Comments under [employer].    

## 2015-09-25 ENCOUNTER — Ambulatory Visit: Payer: Managed Care, Other (non HMO) | Admitting: Family Medicine

## 2015-09-25 ENCOUNTER — Ambulatory Visit (INDEPENDENT_AMBULATORY_CARE_PROVIDER_SITE_OTHER): Payer: Managed Care, Other (non HMO) | Admitting: Family Medicine

## 2015-09-25 ENCOUNTER — Encounter: Payer: Self-pay | Admitting: Family Medicine

## 2015-09-25 ENCOUNTER — Other Ambulatory Visit: Payer: Self-pay | Admitting: Family Medicine

## 2015-09-25 VITALS — BP 100/67 | HR 82 | Temp 98.5°F | Ht 61.5 in | Wt 113.8 lb

## 2015-09-25 DIAGNOSIS — R102 Pelvic and perineal pain: Secondary | ICD-10-CM | POA: Insufficient documentation

## 2015-09-25 DIAGNOSIS — E039 Hypothyroidism, unspecified: Secondary | ICD-10-CM | POA: Diagnosis not present

## 2015-09-25 DIAGNOSIS — N9489 Other specified conditions associated with female genital organs and menstrual cycle: Secondary | ICD-10-CM | POA: Diagnosis not present

## 2015-09-25 DIAGNOSIS — R1030 Lower abdominal pain, unspecified: Secondary | ICD-10-CM | POA: Diagnosis not present

## 2015-09-25 LAB — POCT URINALYSIS DIPSTICK
Bilirubin, UA: NEGATIVE
Blood, UA: NEGATIVE
GLUCOSE UA: NEGATIVE
KETONES UA: NEGATIVE
Leukocytes, UA: NEGATIVE
Nitrite, UA: NEGATIVE
Protein, UA: NEGATIVE
SPEC GRAV UA: 1.02
Urobilinogen, UA: 0.2
pH, UA: 6

## 2015-09-25 MED ORDER — LEVOTHYROXINE SODIUM 50 MCG PO TABS: 50.0000 ug | ORAL_TABLET | Freq: Every day | ORAL | Status: DC

## 2015-09-25 NOTE — Assessment & Plan Note (Signed)
Refer to gyn ? Prolapse uterus---- pt not having feeling now and some bulging palpated on exam but will refer to gyn and have pt come in when pain comes back

## 2015-09-25 NOTE — Telephone Encounter (Signed)
Spoke with patient. Has CPE scheduled 06/2016. Sent to [employer] called and cancelled. Resent to [employer]

## 2015-09-25 NOTE — Progress Notes (Signed)
Patient ID: Kirsten Ramirez, female    DOB: December 05, 1971  Age: 44 y.o. MRN: [id number]    Subjective:  Subjective HPI Kirsten Ramirez presents for to establish.  She complains of pressure in her pelvis-- feels like her uterus is falling out.  The feeling comes and goes.    Review of Systems  Constitutional: Negative for diaphoresis, appetite change, fatigue and unexpected weight change.  Eyes: Negative for pain, redness and visual disturbance.  Respiratory: Negative for cough, chest tightness, shortness of breath and wheezing.   Cardiovascular: Negative for chest pain, palpitations and leg swelling.  Endocrine: Negative for cold intolerance, heat intolerance, polydipsia, polyphagia and polyuria.  Genitourinary: Positive for pelvic pain. Negative for dysuria, frequency, flank pain and difficulty urinating.  Neurological: Negative for dizziness, light-headedness, numbness and headaches.  Psychiatric/Behavioral: Negative for dysphoric mood. The patient is not nervous/anxious.     History Past Medical History  Diagnosis Date  . Allergy   . Thyroid disease     She has past surgical history that includes Appendectomy (1992) and Cesarean section (2009, 2011).   Her family history includes Cancer in her father; Hypertension in her father and mother.She reports that she has never smoked. She has never used smokeless tobacco. She reports that she drinks alcohol. She reports that she does not use illicit drugs.  No current outpatient prescriptions on file prior to visit.   No current facility-administered medications on file prior to visit.     Objective:  Objective Physical Exam  Constitutional: She is oriented to person, place, and time. She appears well-developed and well-nourished.  HENT:  Head: Normocephalic and atraumatic.  Eyes: Conjunctivae and EOM are normal.  Neck: Normal range of motion. Neck supple. No JVD present. Carotid bruit is not present. No thyromegaly present.  Cardiovascular: Normal  rate, regular rhythm and normal heart sounds.   No murmur heard. Pulmonary/Chest: Effort normal and breath sounds normal. No respiratory distress. She has no wheezes. She has no rales. She exhibits no tenderness.  Abdominal: Soft. There is no tenderness. There is no rebound and no guarding.  Musculoskeletal: She exhibits no edema.  Neurological: She is alert and oriented to person, place, and time.  Psychiatric: She has a normal mood and affect. Her behavior is normal. Judgment and thought content normal.  Nursing note and vitals reviewed.  BP 100/67 mmHg  Pulse 82  Temp(Src) 98.5 F (36.9 C) (Oral)  Ht 5' 1.5" (1.562 m)  Wt 113 lb 12.8 oz (51.619 kg)  BMI 21.16 kg/m2  SpO2 98%  LMP 09/19/2015 Wt Readings from Last 3 Encounters:  09/25/15 113 lb 12.8 oz (51.619 kg)  05/20/15 113 lb 12.8 oz (51.619 kg)  04/27/15 112 lb 3.2 oz (50.894 kg)     Lab Results  Component Value Date   WBC 7.1 01/02/2015   HGB 12.8 01/02/2015   HCT 38.6 01/02/2015   PLT 287.0 01/02/2015   GLUCOSE 97 01/02/2015   CHOL 140 01/02/2015   TRIG 119.0 01/02/2015   HDL 41.20 01/02/2015   LDLCALC 75 01/02/2015   ALT 10 01/02/2015   AST 14 01/02/2015   NA 139 01/02/2015   K 3.8 01/02/2015   CL 105 01/02/2015   CREATININE 0.64 01/02/2015   BUN 13 01/02/2015   CO2 30 01/02/2015   TSH 0.86 12/29/2014    Patient was never admitted.   Assessment & Plan:  Plan I have discontinued Ms. Fick's fluticasone, Melatonin, and glucosamine-chondroitin.  No orders of the defined types were placed in  this encounter.    Problem List Items Addressed This Visit    Pelvic pain in female    Refer to gyn ? Prolapse uterus---- pt not having feeling now and some bulging palpated on exam but will refer to gyn and have pt come in when pain comes back      Hypothyroidism    con't synthroid Will check labs at cpe       Other Visit Diagnoses    Suprapubic abdominal pain, unspecified laterality    -  Primary     Relevant Orders    POCT urinalysis dipstick (Completed)    Pelvic pressure in female        Relevant Orders    Ambulatory referral to Gynecology       Follow-up: Return in about 6 months (around 03/25/2016), or if symptoms worsen or fail to improve, for annual exam, fasting.  Loreen Freud, DO

## 2015-09-25 NOTE — Telephone Encounter (Signed)
Pt forgot to ask for refills on her levothyroxine. CPE scheduled with Dr. Laury Axon for 03/29/16 (pt was to move to her as PCP?).

## 2015-09-25 NOTE — Assessment & Plan Note (Signed)
con't synthroid Will check labs at cpe

## 2015-09-25 NOTE — Patient Instructions (Signed)

## 2015-09-25 NOTE — Progress Notes (Signed)
Pre visit review using our clinic review tool, if applicable. No additional management support is needed unless otherwise documented below in the visit note. 

## 2015-09-30 ENCOUNTER — Ambulatory Visit (HOSPITAL_BASED_OUTPATIENT_CLINIC_OR_DEPARTMENT_OTHER)
Admission: RE | Admit: 2015-09-30 | Discharge: 2015-09-30 | Disposition: A | Discharge status: Home / Self Care | Payer: Managed Care, Other (non HMO) | Source: Ambulatory Visit | Attending: Family Medicine | Admitting: Family Medicine

## 2015-09-30 ENCOUNTER — Telehealth: Payer: Self-pay | Admitting: Family Medicine

## 2015-09-30 ENCOUNTER — Ambulatory Visit (INDEPENDENT_AMBULATORY_CARE_PROVIDER_SITE_OTHER): Payer: Managed Care, Other (non HMO) | Admitting: Family Medicine

## 2015-09-30 ENCOUNTER — Encounter: Payer: Self-pay | Admitting: Family Medicine

## 2015-09-30 ENCOUNTER — Other Ambulatory Visit: Payer: Self-pay | Admitting: Family Medicine

## 2015-09-30 VITALS — BP 103/57 | HR 70 | Resp 16 | Ht 62.0 in | Wt 113.0 lb

## 2015-09-30 DIAGNOSIS — R102 Pelvic and perineal pain: Secondary | ICD-10-CM | POA: Diagnosis not present

## 2015-09-30 NOTE — Progress Notes (Signed)
   Subjective:    Patient ID: Kirsten Ramirez, female    DOB: Mar 19, 1971, 44 y.o.   MRN: [id number]  HPI [id number] with history of 2 cesarean sections.  Patient referred for pelvic pain that started 6 months, worse when standing up for long periods of time.  Describes pain as a heaviness, like something is "dragging her down".  Usually happens after several hours of standing up.  Laying down improves the discomfort, but continues to have the pain for the remainder of the day.  Denies difficulty urinating.  Has urgency when she has the pain.    Menses about every 20-30 days.  Bleeds for 7-10 days.    Has BTL.   Review of Systems  Constitutional: Negative for fever, chills and fatigue.  Gastrointestinal: Negative for nausea, vomiting, abdominal pain, diarrhea and rectal pain.  Genitourinary: Positive for urgency, difficulty urinating (when she has the pelvic pain) and dyspareunia. Negative for dysuria, frequency, hematuria, vaginal bleeding, vaginal discharge, vaginal pain and menstrual problem.  All other systems reviewed and are negative.   I have reviewed the patients past medical, family, and social history.  I have reviewed the patient's medication list and allergies.     Objective:   Physical Exam  Constitutional: She is oriented to person, place, and time. She appears well-developed and well-nourished.  HENT:  Head: Normocephalic and atraumatic.  Right Ear: External ear normal.  Left Ear: External ear normal.  Eyes: Conjunctivae are normal. Pupils are equal, round, and reactive to light.  Neck: No JVD present. No tracheal deviation present. No thyromegaly present.  Cardiovascular: Normal rate, regular rhythm and normal heart sounds.  Exam reveals no gallop and no friction rub.   No murmur heard. Pulmonary/Chest: Effort normal and breath sounds normal. No respiratory distress. She has no wheezes. She has no rales. She exhibits no tenderness.  Abdominal: Soft. She exhibits no distension and  no mass. There is no tenderness. There is no rebound and no guarding. Hernia confirmed negative in the right inguinal area and confirmed negative in the left inguinal area.  Genitourinary: No labial fusion. There is no rash, tenderness, lesion or injury on the right labia. There is no rash, tenderness, lesion or injury on the left labia. Uterus is not deviated, not enlarged, not fixed and not tender. Cervix exhibits no motion tenderness, no discharge and no friability. Right adnexum displays no mass, no tenderness and no fullness. Left adnexum displays no mass, no tenderness and no fullness. No erythema, tenderness or bleeding in the vagina. No signs of injury around the vagina. No vaginal discharge found.  Minimal cystocele  Lymphadenopathy:    She has no cervical adenopathy.       Right: No inguinal adenopathy present.       Left: No inguinal adenopathy present.  Neurological: She is alert and oriented to person, place, and time. No cranial nerve deficit.  Skin: Skin is warm and dry. No rash noted. No erythema. No pallor.  Psychiatric: She has a normal mood and affect. Her behavior is normal. Judgment and thought content normal.       Assessment & Plan:  1. Pelvic pain in female No major pelvic organ prolapse seen that would explain patient's pain.  No vaginal discharge or CMT to suggest infection.  Will obtain US. - US Pelvis Complete; Future

## 2015-09-30 NOTE — Telephone Encounter (Signed)
[facility] Can be reached: [phone] option 3 Ref# [id number]  Reason for call: Changing levothyroxine drug manufacturer from [employer] to [facility] and need MD approval. Please call # above with option 3 and provide ref #.

## 2015-10-01 MED ORDER — SYNTHROID 50 MCG PO TABS: 50.0000 ug | ORAL_TABLET | Freq: Every day | ORAL | Status: DC

## 2015-10-01 NOTE — Telephone Encounter (Signed)
Dr.Lowne please advise if it ok to change manufacturer for the Levothyroxine.     KP

## 2015-10-01 NOTE — Telephone Encounter (Signed)
Let patient know what they are asking--- it is actually better to be on brand only but if she can not do that just let her know it may make tsh flucuate

## 2015-10-01 NOTE — Telephone Encounter (Signed)
Spoke with the patient and she has agreed ans wanted to take the Brand name.I called [employer] and spoke with Elijah Birkom and he will stop the Levothyroxine. New Rx sent. He stated if the patient received the generic int he mail as long as she does not open it, she can send it back.    KP

## 2015-10-05 ENCOUNTER — Ambulatory Visit (INDEPENDENT_AMBULATORY_CARE_PROVIDER_SITE_OTHER): Payer: Managed Care, Other (non HMO) | Admitting: Medical

## 2015-10-05 ENCOUNTER — Encounter: Payer: Self-pay | Admitting: Medical

## 2015-10-05 ENCOUNTER — Ambulatory Visit: Payer: Managed Care, Other (non HMO) | Admitting: Medical

## 2015-10-05 VITALS — BP 98/60 | HR 64 | Temp 98.2°F | Ht 62.0 in | Wt 113.0 lb

## 2015-10-05 DIAGNOSIS — J029 Acute pharyngitis, unspecified: Secondary | ICD-10-CM

## 2015-10-05 LAB — POCT RAPID STREP A (OFFICE): Rapid Strep A Screen: POSITIVE — AB

## 2015-10-05 MED ORDER — AZITHROMYCIN 250 MG PO TABS: ORAL_TABLET | ORAL | Status: DC

## 2015-10-05 NOTE — Progress Notes (Signed)
Subjective:    Patient ID: Kirsten Ramirez, female    DOB: 1971/06/14, 44 y.o.   MRN: [id number]  HPI  Pt in throat  with swallowing. Started on Saturday. Pain is moderate-severe. Pt tooks amoxicillin yesterday. These were parents tablets. Pt states now she feels a lot  Bette but still some sore. First day st was so painful  she could not sleep.  LMP-4 days ago.  Some mild body aches and very fatigued first day.  Review of Systems  Constitutional: Negative for fever, chills and fatigue.  HENT: Positive for sore throat. Negative for congestion, ear pain, mouth sores, nosebleeds, postnasal drip and sinus pressure.   Respiratory: Negative for cough, chest tightness and wheezing.   Cardiovascular: Negative for chest pain and palpitations.  Skin: Negative for rash.  Neurological: Negative for dizziness, weakness, light-headedness, numbness and headaches.  Hematological: Negative for adenopathy. Does not bruise/bleed easily.  Psychiatric/Behavioral: Negative for behavioral problems and confusion.    Past Medical History  Diagnosis Date  . Allergy   . Thyroid disease     Social History   Social History  . Marital Status: Married    Spouse Name: N/A  . Number of Children: 2  . 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 Smoking status: Never Smoker   . Smokeless tobacco: Never Used  . Alcohol Use: 0.0 oz/week    0 Standard drinks or equivalent per week     Comment: only very rare and very little around holidays.  . Drug Use: No  . Sexual Activity:    Partners: Male   Other Topics Concern  . Not on file   Social History Narrative   Exercise-- walks in am    Past Surgical History  Procedure Laterality Date  . Appendectomy  1992  . Cesarean section  2009, 2011  . Tubal ligation      Family History  Problem Relation Age of Onset  . Hypertension Mother   . Hypertension Father   . Cancer Father     colon, liver      No Known Allergies  Current Outpatient Prescriptions on File Prior to Visit  Medication Sig Dispense Refill  . levothyroxine (SYNTHROID, LEVOTHROID) 50 MCG tablet Take 1 tablet (50 mcg total) by mouth daily. 90 tablet 2  . SYNTHROID 50 MCG tablet Take 1 tablet (50 mcg total) by mouth daily before breakfast. 90 tablet 3   No current facility-administered medications on file prior to visit.    BP 98/60 mmHg  Pulse 64  Temp(Src) 98.2 F (36.8 C) (Oral)  Ht 5\' 2"  (1.575 m)  Wt 113 lb (51.256 kg)  BMI 20.66 kg/m2  SpO2 99%  LMP 09/19/2015       Objective:   Physical Exam  General  Mental Status - Alert. General Appearance - Well groomed. Not in acute distress.  Skin Rashes- No Rashes.  HEENT Head- Normal. Ear Auditory Canal - Left- Normal. Right - Normal.Tympanic Membrane- Left- Normal. Right- Normal. Eye Sclera/Conjunctiva- Left- Normal. Right- Normal. Nose & Sinuses Nasal Mucosa- Left-  Not boggy or Congested. Right-  Not  boggy or Congested. Mouth & Throat Lips: Upper Lip- Normal: no dryness, cracking, pallor, cyanosis, or vesicular eruption. Lower Lip-Normal: no dryness, cracking, pallor, cyanosis or vesicular eruption. Buccal Mucosa- Bilateral- No Aphthous ulcers. Oropharynx- No  Discharge but bright  Erythema. Tonsils: Characteristics- Bilateral- Moderate Erythema and  Congestion. Size/Enlargement- Bilateral- No enlargement. Discharge- bilateral-None.  Neck Neck- Supple. No Masses.   Chest and Lung Exam Auscultation: Breath Sounds:- even and unlabored.  Cardiovascular Auscultation:Rythm- Regular, rate and rhythm. Murmurs & Other Heart Sounds:Ausculatation of the heart reveal- No Murmurs.  Lymphatic Head & Neck General Head & Neck Lymphatics: Bilateral: Description- No Localized lymphadenopathy.       Assessment & Plan:  Your strep test was positive. I am prescribing azithromycin  antibiotic. Rest hydrate, tylenol for fever and warm salt water  gargles. Follow up in 7 days or as needed.

## 2015-10-05 NOTE — Progress Notes (Signed)
Pre visit review using our clinic review tool, if applicable. No additional management support is needed unless otherwise documented below in the visit note. 

## 2015-10-05 NOTE — Patient Instructions (Signed)
Your strep test was positive. I am prescribing azithromycin antibiotic. Rest hydrate, tylenol for fever and warm salt water gargles. Follow up in 7 days or as needed. 

## 2015-10-12 ENCOUNTER — Telehealth: Payer: Self-pay

## 2015-10-12 NOTE — Telephone Encounter (Signed)
-----   Message from Levie HeritageJacob J Stinson, DO sent at 10/08/2015  2:01 PM EDT ----- Please call the patient - the ultrasound shows a small amount of fluid in her c-section scar, but this shouldn't the cause of her pain.  The ultrasound shows no other abnormality.  If she continues to have a significant amount of discomfort, we can have her make a follow up appt with one of the Gyn surgeons to evaluate her

## 2015-10-12 NOTE — Telephone Encounter (Signed)
Left message for patient to return call to office.  Jennifer Howard RNBSN 

## 2015-10-13 NOTE — Telephone Encounter (Signed)
Attempted to reach patient again, no answer. Armandina StammerJennifer Annalyn Blecher RN BSN

## 2015-10-14 ENCOUNTER — Encounter: Payer: Managed Care, Other (non HMO) | Admitting: Family Medicine

## 2016-02-03 ENCOUNTER — Telehealth: Payer: Self-pay | Admitting: Family Medicine

## 2016-02-03 NOTE — Telephone Encounter (Signed)
Pt needs to be aware that it may mean she needs blood work more often because the generic synthroid is not always reliable.

## 2016-02-03 NOTE — Telephone Encounter (Signed)
Caller name: Fayrene Fearing   Relationship to patient: [employer]   Can be reached: [employer]  Pharmacy: [facility] [place] - [address]  Reason for call: Pharmacy says that pt is requesting a generic for SYNTHROID Rx because medication is to expensive . Pharmacy says that PCP is requesting the brand medication. Pharmacy would like to be advised if it is okay to change pt's medication to generic.

## 2016-02-04 MED ORDER — LEVOTHYROXINE SODIUM 50 MCG PO TABS: 50.0000 ug | ORAL_TABLET | Freq: Every day | ORAL | Status: DC

## 2016-02-04 NOTE — Telephone Encounter (Signed)
Discussed with patient and she stated she has taken the generic for many years, it was changed when Dr.Lowne changed at her last visit.     KP

## 2016-03-07 ENCOUNTER — Telehealth: Payer: Self-pay | Admitting: Medical

## 2016-03-07 NOTE — Telephone Encounter (Signed)
Order was sent to [facility], I will send again

## 2016-03-07 NOTE — Telephone Encounter (Signed)
Ok. Thanks for checking.

## 2016-03-07 NOTE — Telephone Encounter (Signed)
mammogrram ordered but then got note stating never done? Was pt notified of location and time of mammgoram. Will you investigate?

## 2016-03-25 ENCOUNTER — Encounter: Payer: Managed Care, Other (non HMO) | Admitting: Medical

## 2016-03-29 ENCOUNTER — Encounter: Payer: Managed Care, Other (non HMO) | Admitting: Family Medicine

## 2016-03-31 ENCOUNTER — Encounter: Payer: Self-pay | Admitting: Family Medicine

## 2016-03-31 ENCOUNTER — Telehealth: Payer: Self-pay | Admitting: Family Medicine

## 2016-03-31 NOTE — Telephone Encounter (Signed)
Attempted to contact patient to reschedule appointment with Dr. Zola ButtonLowne-Chase scheduled for June 24, 2016. Left message for patient to call back, letter mailed and appointment cancelled 03/31/2016

## 2016-04-05 ENCOUNTER — Encounter: Payer: Self-pay | Admitting: Family Medicine

## 2016-04-05 ENCOUNTER — Ambulatory Visit (INDEPENDENT_AMBULATORY_CARE_PROVIDER_SITE_OTHER): Payer: Managed Care, Other (non HMO) | Admitting: Family Medicine

## 2016-04-05 ENCOUNTER — Other Ambulatory Visit (HOSPITAL_COMMUNITY)
Admission: RE | Admit: 2016-04-05 | Discharge: 2016-04-05 | Disposition: A | Discharge status: Home / Self Care | Payer: Managed Care, Other (non HMO) | Source: Ambulatory Visit | Attending: Family Medicine | Admitting: Family Medicine

## 2016-04-05 VITALS — BP 96/64 | HR 94 | Temp 98.0°F | Ht 61.5 in | Wt 114.8 lb

## 2016-04-05 DIAGNOSIS — R319 Hematuria, unspecified: Secondary | ICD-10-CM | POA: Diagnosis not present

## 2016-04-05 DIAGNOSIS — Z1151 Encounter for screening for human papillomavirus (HPV): Secondary | ICD-10-CM | POA: Insufficient documentation

## 2016-04-05 DIAGNOSIS — Z124 Encounter for screening for malignant neoplasm of cervix: Secondary | ICD-10-CM | POA: Diagnosis not present

## 2016-04-05 DIAGNOSIS — E039 Hypothyroidism, unspecified: Secondary | ICD-10-CM

## 2016-04-05 DIAGNOSIS — Z0000 Encounter for general adult medical examination without abnormal findings: Secondary | ICD-10-CM | POA: Diagnosis not present

## 2016-04-05 DIAGNOSIS — Z01419 Encounter for gynecological examination (general) (routine) without abnormal findings: Secondary | ICD-10-CM | POA: Insufficient documentation

## 2016-04-05 LAB — POCT URINALYSIS DIPSTICK
Bilirubin, UA: NEGATIVE
Glucose, UA: NEGATIVE
LEUKOCYTES UA: NEGATIVE
NITRITE UA: NEGATIVE
PH UA: 5.5
PROTEIN UA: NEGATIVE
Spec Grav, UA: >=1.03
Urobilinogen, UA: 0.2

## 2016-04-05 MED ORDER — LEVOTHYROXINE SODIUM 50 MCG PO TABS: 50.0000 ug | ORAL_TABLET | Freq: Every day | ORAL | Status: DC

## 2016-04-05 MED ORDER — NAPHAZOLINE-PHENIRAMINE 0.025-0.3 % OP SOLN: 1.0000 [drp] | Freq: Four times a day (QID) | OPHTHALMIC | Status: DC | PRN

## 2016-04-05 NOTE — Progress Notes (Signed)
Pre visit review using our clinic review tool, if applicable. No additional management support is needed unless otherwise documented below in the visit note. 

## 2016-04-05 NOTE — Patient Instructions (Signed)
Preventive Care for Adults, Female A healthy lifestyle and preventive care can promote health and wellness. Preventive health guidelines for women include the following key practices.  A routine yearly physical is a good way to check with your health care provider about your health and preventive screening. It is a chance to share any concerns and updates on your health and to receive a thorough exam.  Visit your dentist for a routine exam and preventive care every 6 months. Brush your teeth twice a day and floss once a day. Good oral hygiene prevents tooth decay and gum disease.  The frequency of eye exams is based on your age, health, family medical history, use of contact lenses, and other factors. Follow your health care provider's recommendations for frequency of eye exams.  Eat a healthy diet. Foods like vegetables, fruits, whole grains, low-fat dairy products, and lean protein foods contain the nutrients you need without too many calories. Decrease your intake of foods high in solid fats, added sugars, and salt. Eat the right amount of calories for you.Get information about a proper diet from your health care provider, if necessary.  Regular physical exercise is one of the most important things you can do for your health. Most adults should get at least 150 minutes of moderate-intensity exercise (any activity that increases your heart rate and causes you to sweat) each week. In addition, most adults need muscle-strengthening exercises on 2 or more days a week.  Maintain a healthy weight. The body mass index (BMI) is a screening tool to identify possible weight problems. It provides an estimate of body fat based on height and weight. Your health care provider can find your BMI and can help you achieve or maintain a healthy weight.For adults 20 years and older:  A BMI below 18.5 is considered underweight.  A BMI of 18.5 to 24.9 is normal.  A BMI of 25 to 29.9 is considered overweight.  A  BMI of 30 and above is considered obese.  Maintain normal blood lipids and cholesterol levels by exercising and minimizing your intake of saturated fat. Eat a balanced diet with plenty of fruit and vegetables. Blood tests for lipids and cholesterol should begin at age 45 and be repeated every 5 years. If your lipid or cholesterol levels are high, you are over 50, or you are at high risk for heart disease, you may need your cholesterol levels checked more frequently.Ongoing high lipid and cholesterol levels should be treated with medicines if diet and exercise are not working.  If you smoke, find out from your health care provider how to quit. If you do not use tobacco, do not start.  Lung cancer screening is recommended for adults aged 45-80 years who are at high risk for developing lung cancer because of a history of smoking. A yearly low-dose CT scan of the lungs is recommended for people who have at least a 30-pack-year history of smoking and are a current smoker or have quit within the past 15 years. A pack year of smoking is smoking an average of 1 pack of cigarettes a day for 1 year (for example: 1 pack a day for 30 years or 2 packs a day for 15 years). Yearly screening should continue until the smoker has stopped smoking for at least 15 years. Yearly screening should be stopped for people who develop a health problem that would prevent them from having lung cancer treatment.  If you are pregnant, do not drink alcohol. If you are  breastfeeding, be very cautious about drinking alcohol. If you are not pregnant and choose to drink alcohol, do not have more than 1 drink per day. One drink is considered to be 12 ounces (355 mL) of beer, 5 ounces (148 mL) of wine, or 1.5 ounces (44 mL) of liquor.  Avoid use of street drugs. Do not share needles with anyone. Ask for help if you need support or instructions about stopping the use of drugs.  High blood pressure causes heart disease and increases the risk  of stroke. Your blood pressure should be checked at least every 1 to 2 years. Ongoing high blood pressure should be treated with medicines if weight loss and exercise do not work.  If you are 55-79 years old, ask your health care provider if you should take aspirin to prevent strokes.  Diabetes screening is done by taking a blood sample to check your blood glucose level after you have not eaten for a certain period of time (fasting). If you are not overweight and you do not have risk factors for diabetes, you should be screened once every 3 years starting at age 45. If you are overweight or obese and you are 40-70 years of age, you should be screened for diabetes every year as part of your cardiovascular risk assessment.  Breast cancer screening is essential preventive care for women. You should practice "breast self-awareness." This means understanding the normal appearance and feel of your breasts and may include breast self-examination. Any changes detected, no matter how small, should be reported to a health care provider. Women in their 20s and 30s should have a clinical breast exam (CBE) by a health care provider as part of a regular health exam every 1 to 3 years. After age 40, women should have a CBE every year. Starting at age 40, women should consider having a mammogram (breast X-ray test) every year. Women who have a family history of breast cancer should talk to their health care provider about genetic screening. Women at a high risk of breast cancer should talk to their health care providers about having an MRI and a mammogram every year.  Breast cancer gene (BRCA)-related cancer risk assessment is recommended for women who have family members with BRCA-related cancers. BRCA-related cancers include breast, ovarian, tubal, and peritoneal cancers. Having family members with these cancers may be associated with an increased risk for harmful changes (mutations) in the breast cancer genes BRCA1 and  BRCA2. Results of the assessment will determine the need for genetic counseling and BRCA1 and BRCA2 testing.  Your health care provider may recommend that you be screened regularly for cancer of the pelvic organs (ovaries, uterus, and vagina). This screening involves a pelvic examination, including checking for microscopic changes to the surface of your cervix (Pap test). You may be encouraged to have this screening done every 3 years, beginning at age 21.  For women ages 30-65, health care providers may recommend pelvic exams and Pap testing every 3 years, or they may recommend the Pap and pelvic exam, combined with testing for human papilloma virus (HPV), every 5 years. Some types of HPV increase your risk of cervical cancer. Testing for HPV may also be done on women of any age with unclear Pap test results.  Other health care providers may not recommend any screening for nonpregnant women who are considered low risk for pelvic cancer and who do not have symptoms. Ask your health care provider if a screening pelvic exam is right for   you.  If you have had past treatment for cervical cancer or a condition that could lead to cancer, you need Pap tests and screening for cancer for at least 20 years after your treatment. If Pap tests have been discontinued, your risk factors (such as having a new sexual partner) need to be reassessed to determine if screening should resume. Some women have medical problems that increase the chance of getting cervical cancer. In these cases, your health care provider may recommend more frequent screening and Pap tests.  Colorectal cancer can be detected and often prevented. Most routine colorectal cancer screening begins at the age of 50 years and continues through age 75 years. However, your health care provider may recommend screening at an earlier age if you have risk factors for colon cancer. On a yearly basis, your health care provider may provide home test kits to check  for hidden blood in the stool. Use of a small camera at the end of a tube, to directly examine the colon (sigmoidoscopy or colonoscopy), can detect the earliest forms of colorectal cancer. Talk to your health care provider about this at age 50, when routine screening begins. Direct exam of the colon should be repeated every 5-10 years through age 75 years, unless early forms of precancerous polyps or small growths are found.  People who are at an increased risk for hepatitis B should be screened for this virus. You are considered at high risk for hepatitis B if:  You were born in a country where hepatitis B occurs often. Talk with your health care provider about which countries are considered high risk.  Your parents were born in a high-risk country and you have not received a shot to protect against hepatitis B (hepatitis B vaccine).  You have HIV or AIDS.  You use needles to inject street drugs.  You live with, or have sex with, someone who has hepatitis B.  You get hemodialysis treatment.  You take certain medicines for conditions like cancer, organ transplantation, and autoimmune conditions.  Hepatitis C blood testing is recommended for all people born from 1945 through 1965 and any individual with known risks for hepatitis C.  Practice safe sex. Use condoms and avoid high-risk sexual practices to reduce the spread of sexually transmitted infections (STIs). STIs include gonorrhea, chlamydia, syphilis, trichomonas, herpes, HPV, and human immunodeficiency virus (HIV). Herpes, HIV, and HPV are viral illnesses that have no cure. They can result in disability, cancer, and death.  You should be screened for sexually transmitted illnesses (STIs) including gonorrhea and chlamydia if:  You are sexually active and are younger than 24 years.  You are older than 24 years and your health care provider tells you that you are at risk for this type of infection.  Your sexual activity has changed  since you were last screened and you are at an increased risk for chlamydia or gonorrhea. Ask your health care provider if you are at risk.  If you are at risk of being infected with HIV, it is recommended that you take a prescription medicine daily to prevent HIV infection. This is called preexposure prophylaxis (PrEP). You are considered at risk if:  You are sexually active and do not regularly use condoms or know the HIV status of your partner(s).  You take drugs by injection.  You are sexually active with a partner who has HIV.  Talk with your health care provider about whether you are at high risk of being infected with HIV. If   you choose to begin PrEP, you should first be tested for HIV. You should then be tested every 3 months for as long as you are taking PrEP.  Osteoporosis is a disease in which the bones lose minerals and strength with aging. This can result in serious bone fractures or breaks. The risk of osteoporosis can be identified using a bone density scan. Women ages 67 years and over and women at risk for fractures or osteoporosis should discuss screening with their health care providers. Ask your health care provider whether you should take a calcium supplement or vitamin D to reduce the rate of osteoporosis.  Menopause can be associated with physical symptoms and risks. Hormone replacement therapy is available to decrease symptoms and risks. You should talk to your health care provider about whether hormone replacement therapy is right for you.  Use sunscreen. Apply sunscreen liberally and repeatedly throughout the day. You should seek shade when your shadow is shorter than you. Protect yourself by wearing long sleeves, pants, a wide-brimmed hat, and sunglasses year round, whenever you are outdoors.  Once a month, do a whole body skin exam, using a mirror to look at the skin on your back. Tell your health care provider of new moles, moles that have irregular borders, moles that  are larger than a pencil eraser, or moles that have changed in shape or color.  Stay current with required vaccines (immunizations).  Influenza vaccine. All adults should be immunized every year.  Tetanus, diphtheria, and acellular pertussis (Td, Tdap) vaccine. Pregnant women should receive 1 dose of Tdap vaccine during each pregnancy. The dose should be obtained regardless of the length of time since the last dose. Immunization is preferred during the 27th-36th week of gestation. An adult who has not previously received Tdap or who does not know her vaccine status should receive 1 dose of Tdap. This initial dose should be followed by tetanus and diphtheria toxoids (Td) booster doses every 10 years. Adults with an unknown or incomplete history of completing a 3-dose immunization series with Td-containing vaccines should begin or complete a primary immunization series including a Tdap dose. Adults should receive a Td booster every 10 years.  Varicella vaccine. An adult without evidence of immunity to varicella should receive 2 doses or a second dose if she has previously received 1 dose. Pregnant females who do not have evidence of immunity should receive the first dose after pregnancy. This first dose should be obtained before leaving the health care facility. The second dose should be obtained 4-8 weeks after the first dose.  Human papillomavirus (HPV) vaccine. Females aged 13-26 years who have not received the vaccine previously should obtain the 3-dose series. The vaccine is not recommended for use in pregnant females. However, pregnancy testing is not needed before receiving a dose. If a female is found to be pregnant after receiving a dose, no treatment is needed. In that case, the remaining doses should be delayed until after the pregnancy. Immunization is recommended for any person with an immunocompromised condition through the age of 61 years if she did not get any or all doses earlier. During the  3-dose series, the second dose should be obtained 4-8 weeks after the first dose. The third dose should be obtained 24 weeks after the first dose and 16 weeks after the second dose.  Zoster vaccine. One dose is recommended for adults aged 30 years or older unless certain conditions are present.  Measles, mumps, and rubella (MMR) vaccine. Adults born  before 1957 generally are considered immune to measles and mumps. Adults born in 1957 or later should have 1 or more doses of MMR vaccine unless there is a contraindication to the vaccine or there is laboratory evidence of immunity to each of the three diseases. A routine second dose of MMR vaccine should be obtained at least 28 days after the first dose for students attending postsecondary schools, health care workers, or international travelers. People who received inactivated measles vaccine or an unknown type of measles vaccine during 1963-1967 should receive 2 doses of MMR vaccine. People who received inactivated mumps vaccine or an unknown type of mumps vaccine before 1979 and are at high risk for mumps infection should consider immunization with 2 doses of MMR vaccine. For females of childbearing age, rubella immunity should be determined. If there is no evidence of immunity, females who are not pregnant should be vaccinated. If there is no evidence of immunity, females who are pregnant should delay immunization until after pregnancy. Unvaccinated health care workers born before 1957 who lack laboratory evidence of measles, mumps, or rubella immunity or laboratory confirmation of disease should consider measles and mumps immunization with 2 doses of MMR vaccine or rubella immunization with 1 dose of MMR vaccine.  Pneumococcal 13-valent conjugate (PCV13) vaccine. When indicated, a person who is uncertain of his immunization history and has no record of immunization should receive the PCV13 vaccine. All adults 65 years of age and older should receive this  vaccine. An adult aged 19 years or older who has certain medical conditions and has not been previously immunized should receive 1 dose of PCV13 vaccine. This PCV13 should be followed with a dose of pneumococcal polysaccharide (PPSV23) vaccine. Adults who are at high risk for pneumococcal disease should obtain the PPSV23 vaccine at least 8 weeks after the dose of PCV13 vaccine. Adults older than 47 years of age who have normal immune system function should obtain the PPSV23 vaccine dose at least 1 year after the dose of PCV13 vaccine.  Pneumococcal polysaccharide (PPSV23) vaccine. When PCV13 is also indicated, PCV13 should be obtained first. All adults aged 65 years and older should be immunized. An adult younger than age 65 years who has certain medical conditions should be immunized. Any person who resides in a nursing home or long-term care facility should be immunized. An adult smoker should be immunized. People with an immunocompromised condition and certain other conditions should receive both PCV13 and PPSV23 vaccines. People with human immunodeficiency virus (HIV) infection should be immunized as soon as possible after diagnosis. Immunization during chemotherapy or radiation therapy should be avoided. Routine use of PPSV23 vaccine is not recommended for American Indians, [place] Natives, or people younger than 65 years unless there are medical conditions that require PPSV23 vaccine. When indicated, people who have unknown immunization and have no record of immunization should receive PPSV23 vaccine. One-time revaccination 5 years after the first dose of PPSV23 is recommended for people aged 19-64 years who have chronic kidney failure, nephrotic syndrome, asplenia, or immunocompromised conditions. People who received 1-2 doses of PPSV23 before age 65 years should receive another dose of PPSV23 vaccine at age 65 years or later if at least 5 years have passed since the previous dose. Doses of PPSV23 are not  needed for people immunized with PPSV23 at or after age 65 years.  Meningococcal vaccine. Adults with asplenia or persistent complement component deficiencies should receive 2 doses of quadrivalent meningococcal conjugate (MenACWY-D) vaccine. The doses should be obtained   at least 2 months apart. Microbiologists working with certain meningococcal bacteria, [place] recruits, people at risk during an outbreak, and people who travel to or live in countries with a high rate of meningitis should be immunized. A first-year college student up through age 34 years who is living in a residence hall should receive a dose if she did not receive a dose on or after her 16th birthday. Adults who have certain high-risk conditions should receive one or more doses of vaccine.  Hepatitis A vaccine. Adults who wish to be protected from this disease, have certain high-risk conditions, work with hepatitis A-infected animals, work in hepatitis A research labs, or travel to or work in countries with a high rate of hepatitis A should be immunized. Adults who were previously unvaccinated and who anticipate close contact with an international adoptee during the first 60 days after arrival in the [place] from a country with a high rate of hepatitis A should be immunized.  Hepatitis B vaccine. Adults who wish to be protected from this disease, have certain high-risk conditions, may be exposed to blood or other infectious body fluids, are household contacts or sex partners of hepatitis B positive people, are clients or workers in certain care facilities, or travel to or work in countries with a high rate of hepatitis B should be immunized.  Haemophilus influenzae type b (Hib) vaccine. A previously unvaccinated person with asplenia or sickle cell disease or having a scheduled splenectomy should receive 1 dose of Hib vaccine. Regardless of previous immunization, a recipient of a hematopoietic stem cell transplant should receive a  3-dose series 6-12 months after her successful transplant. Hib vaccine is not recommended for adults with HIV infection. Preventive Services / Frequency Ages 35 to 4 years  Blood pressure check.** / Every 3-5 years.  Lipid and cholesterol check.** / Every 5 years beginning at age 60.  Clinical breast exam.** / Every 3 years for women in their 71s and 10s.  BRCA-related cancer risk assessment.** / For women who have family members with a BRCA-related cancer (breast, ovarian, tubal, or peritoneal cancers).  Pap test.** / Every 2 years from ages 76 through 26. Every 3 years starting at age 61 through age 76 or 93 with a history of 3 consecutive normal Pap tests.  HPV screening.** / Every 3 years from ages 37 through ages 60 to 51 with a history of 3 consecutive normal Pap tests.  Hepatitis C blood test.** / For any individual with known risks for hepatitis C.  Skin self-exam. / Monthly.  Influenza vaccine. / Every year.  Tetanus, diphtheria, and acellular pertussis (Tdap, Td) vaccine.** / Consult your health care provider. Pregnant women should receive 1 dose of Tdap vaccine during each pregnancy. 1 dose of Td every 10 years.  Varicella vaccine.** / Consult your health care provider. Pregnant females who do not have evidence of immunity should receive the first dose after pregnancy.  HPV vaccine. / 3 doses over 6 months, if 93 and younger. The vaccine is not recommended for use in pregnant females. However, pregnancy testing is not needed before receiving a dose.  Measles, mumps, rubella (MMR) vaccine.** / You need at least 1 dose of MMR if you were born in 1957 or later. You may also need a 2nd dose. For females of childbearing age, rubella immunity should be determined. If there is no evidence of immunity, females who are not pregnant should be vaccinated. If there is no evidence of immunity, females who are  pregnant should delay immunization until after pregnancy.  Pneumococcal  13-valent conjugate (PCV13) vaccine.** / Consult your health care provider.  Pneumococcal polysaccharide (PPSV23) vaccine.** / 1 to 2 doses if you smoke cigarettes or if you have certain conditions.  Meningococcal vaccine.** / 1 dose if you are age 68 to 8 years and a Market researcher living in a residence hall, or have one of several medical conditions, you need to get vaccinated against meningococcal disease. You may also need additional booster doses.  Hepatitis A vaccine.** / Consult your health care provider.  Hepatitis B vaccine.** / Consult your health care provider.  Haemophilus influenzae type b (Hib) vaccine.** / Consult your health care provider. Ages 7 to 53 years  Blood pressure check.** / Every year.  Lipid and cholesterol check.** / Every 5 years beginning at age 25 years.  Lung cancer screening. / Every year if you are aged 11-80 years and have a 30-pack-year history of smoking and currently smoke or have quit within the past 15 years. Yearly screening is stopped once you have quit smoking for at least 15 years or develop a health problem that would prevent you from having lung cancer treatment.  Clinical breast exam.** / Every year after age 48 years.  BRCA-related cancer risk assessment.** / For women who have family members with a BRCA-related cancer (breast, ovarian, tubal, or peritoneal cancers).  Mammogram.** / Every year beginning at age 41 years and continuing for as long as you are in good health. Consult with your health care provider.  Pap test.** / Every 3 years starting at age 65 years through age 37 or 70 years with a history of 3 consecutive normal Pap tests.  HPV screening.** / Every 3 years from ages 72 years through ages 60 to 40 years with a history of 3 consecutive normal Pap tests.  Fecal occult blood test (FOBT) of stool. / Every year beginning at age 21 years and continuing until age 5 years. You may not need to do this test if you get  a colonoscopy every 10 years.  Flexible sigmoidoscopy or colonoscopy.** / Every 5 years for a flexible sigmoidoscopy or every 10 years for a colonoscopy beginning at age 35 years and continuing until age 48 years.  Hepatitis C blood test.** / For all people born from [birth year] through 1965 and any individual with known risks for hepatitis C.  Skin self-exam. / Monthly.  Influenza vaccine. / Every year.  Tetanus, diphtheria, and acellular pertussis (Tdap/Td) vaccine.** / Consult your health care provider. Pregnant women should receive 1 dose of Tdap vaccine during each pregnancy. 1 dose of Td every 10 years.  Varicella vaccine.** / Consult your health care provider. Pregnant females who do not have evidence of immunity should receive the first dose after pregnancy.  Zoster vaccine.** / 1 dose for adults aged 30 years or older.  Measles, mumps, rubella (MMR) vaccine.** / You need at least 1 dose of MMR if you were born in 1957 or later. You may also need a second dose. For females of childbearing age, rubella immunity should be determined. If there is no evidence of immunity, females who are not pregnant should be vaccinated. If there is no evidence of immunity, females who are pregnant should delay immunization until after pregnancy.  Pneumococcal 13-valent conjugate (PCV13) vaccine.** / Consult your health care provider.  Pneumococcal polysaccharide (PPSV23) vaccine.** / 1 to 2 doses if you smoke cigarettes or if you have certain conditions.  Meningococcal vaccine.** /  Consult your health care provider.  Hepatitis A vaccine.** / Consult your health care provider.  Hepatitis B vaccine.** / Consult your health care provider.  Haemophilus influenzae type b (Hib) vaccine.** / Consult your health care provider. Ages 64 years and over  Blood pressure check.** / Every year.  Lipid and cholesterol check.** / Every 5 years beginning at age 23 years.  Lung cancer screening. / Every year if you  are aged 16-80 years and have a 30-pack-year history of smoking and currently smoke or have quit within the past 15 years. Yearly screening is stopped once you have quit smoking for at least 15 years or develop a health problem that would prevent you from having lung cancer treatment.  Clinical breast exam.** / Every year after age 74 years.  BRCA-related cancer risk assessment.** / For women who have family members with a BRCA-related cancer (breast, ovarian, tubal, or peritoneal cancers).  Mammogram.** / Every year beginning at age 44 years and continuing for as long as you are in good health. Consult with your health care provider.  Pap test.** / Every 3 years starting at age 58 years through age 22 or 39 years with 3 consecutive normal Pap tests. Testing can be stopped between 65 and 70 years with 3 consecutive normal Pap tests and no abnormal Pap or HPV tests in the past 10 years.  HPV screening.** / Every 3 years from ages 64 years through ages 70 or 61 years with a history of 3 consecutive normal Pap tests. Testing can be stopped between 65 and 70 years with 3 consecutive normal Pap tests and no abnormal Pap or HPV tests in the past 10 years.  Fecal occult blood test (FOBT) of stool. / Every year beginning at age 40 years and continuing until age 27 years. You may not need to do this test if you get a colonoscopy every 10 years.  Flexible sigmoidoscopy or colonoscopy.** / Every 5 years for a flexible sigmoidoscopy or every 10 years for a colonoscopy beginning at age 7 years and continuing until age 32 years.  Hepatitis C blood test.** / For all people born from [birth year] through 1965 and any individual with known risks for hepatitis C.  Osteoporosis screening.** / A one-time screening for women ages 30 years and over and women at risk for fractures or osteoporosis.  Skin self-exam. / Monthly.  Influenza vaccine. / Every year.  Tetanus, diphtheria, and acellular pertussis (Tdap/Td)  vaccine.** / 1 dose of Td every 10 years.  Varicella vaccine.** / Consult your health care provider.  Zoster vaccine.** / 1 dose for adults aged 35 years or older.  Pneumococcal 13-valent conjugate (PCV13) vaccine.** / Consult your health care provider.  Pneumococcal polysaccharide (PPSV23) vaccine.** / 1 dose for all adults aged 46 years and older.  Meningococcal vaccine.** / Consult your health care provider.  Hepatitis A vaccine.** / Consult your health care provider.  Hepatitis B vaccine.** / Consult your health care provider.  Haemophilus influenzae type b (Hib) vaccine.** / Consult your health care provider. ** Family history and personal history of risk and conditions may change your health care provider's recommendations.   This information is not intended to replace advice given to you by your health care provider. Make sure you discuss any questions you have with your health care provider.   Document Released: 01/31/2002 Document Revised: 12/26/2014 Document Reviewed: 05/02/2011 Elsevier Interactive Patient Education [employer].

## 2016-04-05 NOTE — Progress Notes (Signed)
Subjective:     Kirsten Ramirez is a 45 y.o. female and is here for a comprehensive physical exam. The patient reports no problems.  Social History   Social History  . Marital Status: Married    Spouse Name: N/A  . Number of Children: 2  . 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 Smoking status: Never Smoker   . Smokeless tobacco: Never Used  . Alcohol Use: 0.0 oz/week    0 Standard drinks or equivalent per week     Comment: only very rare and very little around holidays.  . Drug Use: No  . Sexual Activity:    Partners: Male   Other Topics Concern  . Not on file   Social History Narrative   Exercise-- walks in am   Health Maintenance  Topic Date Due  . HIV Screening  09/14/1984  . MAMMOGRAM  02/19/2016  . INFLUENZA VACCINE  09/24/2016 (Originally 07/19/2016)  . PAP SMEAR  01/13/2017  . TETANUS/TDAP  12/22/2022    The following portions of the patient's history were reviewed and updated as appropriate:  She  has a past medical history of Allergy and Thyroid disease. She  does not have any pertinent problems on file. She  has past surgical history that includes Appendectomy (1992); Cesarean section (2009, 2011); and Tubal ligation. Her family history includes Cancer in her father; Hypertension in her father and mother. She  reports that she has never smoked. She has never used smokeless tobacco. She reports that she drinks alcohol. She reports that she does not use illicit drugs. She has a current medication list which includes the following prescription(s): levothyroxine and naphazoline-pheniramine. No current outpatient prescriptions on file prior to visit.   No current facility-administered medications on file prior to visit.   She has No Known Allergies..  Review of Systems Review of Systems  Constitutional: Negative for activity change, appetite change and fatigue.  HENT: Negative for hearing loss,  congestion, tinnitus and ear discharge.  dentist q61m Eyes: Negative for visual disturbance (see optho q1y -- vision corrected to 20/20 with glasses).  Respiratory: Negative for cough, chest tightness and shortness of breath.   Cardiovascular: Negative for chest pain, palpitations and leg swelling.  Gastrointestinal: Negative for abdominal pain, diarrhea, constipation and abdominal distention.  Genitourinary: Negative for urgency, frequency, decreased urine volume and difficulty urinating.  Musculoskeletal: Negative for back pain, arthralgias and gait problem.  Skin: Negative for color change, pallor and rash.  Neurological: Negative for dizziness, light-headedness, numbness and headaches.  Hematological: Negative for adenopathy. Does not bruise/bleed easily.  Psychiatric/Behavioral: Negative for suicidal ideas, confusion, sleep disturbance, self-injury, dysphoric mood, decreased concentration and agitation.       Objective:    BP 96/64 mmHg  Pulse 94  Temp(Src) 98 F (36.7 C) (Oral)  Ht 5' 1.5" (1.562 m)  Wt 114 lb 12.8 oz (52.073 kg)  BMI 21.34 kg/m2  SpO2 98%  LMP 03/14/2016 General appearance: alert, cooperative, appears stated age and no distress Head: Normocephalic, without obvious abnormality, atraumatic Eyes: conjunctivae/corneas clear. PERRL, EOM's intact. Fundi benign. Ears: normal TM's and external ear canals both ears Nose: Nares normal. Septum midline. Mucosa normal. No drainage or sinus tenderness. Throat: lips, mucosa, and tongue normal; teeth and gums normal Neck: no adenopathy, supple, symmetrical, trachea midline and thyroid not enlarged, symmetric, no tenderness/mass/nodules Back: symmetric, no curvature. ROM normal. No CVA tenderness. Lungs:  clear to auscultation bilaterally Breasts: normal appearance, no masses or tenderness Heart: regular rate and rhythm, S1, S2 normal, no murmur, click, rub or gallop Abdomen: soft, non-tender; bowel sounds normal; no  masses,  no organomegaly Pelvic: cervix normal in appearance, external genitalia normal, no adnexal masses or tenderness, no cervical motion tenderness, rectovaginal septum normal, uterus normal size, shape, and consistency, vagina normal without discharge and pap done, rectal heme neg brown stool Extremities: extremities normal, atraumatic, no cyanosis or edema Pulses: 2+ and symmetric Skin: Skin color, texture, turgor normal. No rashes or lesions Lymph nodes: Cervical, supraclavicular, and axillary nodes normal. Neurologic: Alert and oriented X 3, normal strength and tone. Normal symmetric reflexes. Normal coordination and gait Psych- no depression, no anxiety      Assessment:    Healthy female exam.       Plan:    ghm utd Check labs See After Visit Summary for Counseling Recommendations   1. Hypothyroidism, unspecified hypothyroidism type con't synthroid - Comprehensive metabolic panel - CBC with Differential/Platelet - Lipid panel - POCT urinalysis dipstick - TSH  2. Preventative health care See above - Comprehensive metabolic panel - CBC with Differential/Platelet - Lipid panel - POCT urinalysis dipstick - TSH  3. Hematuria   - Urine culture  4. Screening for malignant neoplasm of cervix   - Cytology - PAP

## 2016-04-06 LAB — COMPREHENSIVE METABOLIC PANEL
ALBUMIN: 4.5 g/dL (ref 3.5–5.2)
ALK PHOS: 38 U/L — AB (ref 39–117)
ALT: 14 U/L (ref 0–35)
AST: 13 U/L (ref 0–37)
BILIRUBIN TOTAL: 1 mg/dL (ref 0.2–1.2)
BUN: 14 mg/dL (ref 6–23)
CO2: 28 mEq/L (ref 19–32)
CREATININE: 0.71 mg/dL (ref 0.40–1.20)
Calcium: 9.6 mg/dL (ref 8.4–10.5)
Chloride: 105 mEq/L (ref 96–112)
GFR: 93.96 mL/min (ref >60.00)
GLUCOSE: 94 mg/dL (ref 70–99)
Potassium: 4.7 mEq/L (ref 3.5–5.1)
SODIUM: 140 meq/L (ref 135–145)
TOTAL PROTEIN: 7 g/dL (ref 6.0–8.3)

## 2016-04-06 LAB — CBC WITH DIFFERENTIAL/PLATELET
BASOS ABS: 0 10*3/uL (ref 0.0–0.1)
Basophils Relative: 0.4 % (ref 0.0–3.0)
EOS ABS: 0.2 10*3/uL (ref 0.0–0.7)
EOS PCT: 2.6 % (ref 0.0–5.0)
HCT: 37.3 % (ref 36.0–46.0)
HEMOGLOBIN: 12.6 g/dL (ref 12.0–15.0)
LYMPHS ABS: 2.3 10*3/uL (ref 0.7–4.0)
Lymphocytes Relative: 31.1 % (ref 12.0–46.0)
MCHC: 33.8 g/dL (ref 30.0–36.0)
MCV: 87.2 fl (ref 78.0–100.0)
MONO ABS: 0.4 10*3/uL (ref 0.1–1.0)
Monocytes Relative: 5.5 % (ref 3.0–12.0)
NEUTROS PCT: 60.4 % (ref 43.0–77.0)
Neutro Abs: 4.4 10*3/uL (ref 1.4–7.7)
Platelets: 220 10*3/uL (ref 150.0–400.0)
RBC: 4.28 Mil/uL (ref 3.87–5.11)
RDW: 13.4 % (ref 11.5–15.5)
WBC: 7.3 10*3/uL (ref 4.0–10.5)

## 2016-04-06 LAB — LIPID PANEL
CHOLESTEROL: 146 mg/dL (ref 0–200)
HDL: 57.8 mg/dL (ref >39.00)
LDL CALC: 80 mg/dL (ref 0–99)
NonHDL: 88.58
TRIGLYCERIDES: 45 mg/dL (ref 0.0–149.0)
Total CHOL/HDL Ratio: 3
VLDL: 9 mg/dL (ref 0.0–40.0)

## 2016-04-06 LAB — URINE CULTURE
COLONY COUNT: NO GROWTH
Organism ID, Bacteria: NO GROWTH

## 2016-04-06 LAB — TSH: TSH: 0.73 u[IU]/mL (ref 0.35–4.50)

## 2016-04-08 LAB — CYTOLOGY - PAP

## 2016-04-14 ENCOUNTER — Telehealth: Payer: Self-pay | Admitting: Family Medicine

## 2016-04-14 DIAGNOSIS — E039 Hypothyroidism, unspecified: Secondary | ICD-10-CM

## 2016-04-14 MED ORDER — LEVOTHYROXINE SODIUM 50 MCG PO TABS: 50.0000 ug | ORAL_TABLET | Freq: Every day | ORAL | Status: DC

## 2016-04-14 NOTE — Telephone Encounter (Signed)
Caller name: Self  Can be reached: [phone]  Pharmacy:  [facility] [place] - [address] [phone] (Phone) [phone] (Fax)        Reason for call: Request refill on levothyroxine (SYNTHROID, LEVOTHROID) 50 MCG tablet [[id number]

## 2016-04-14 NOTE — Telephone Encounter (Signed)
Rx faxed.    KP 

## 2016-06-24 ENCOUNTER — Encounter: Payer: Managed Care, Other (non HMO) | Admitting: Family Medicine

## 2017-04-10 ENCOUNTER — Encounter: Payer: Self-pay | Admitting: Family Medicine

## 2017-04-10 ENCOUNTER — Ambulatory Visit (INDEPENDENT_AMBULATORY_CARE_PROVIDER_SITE_OTHER): Payer: Managed Care, Other (non HMO) | Admitting: Family Medicine

## 2017-04-10 VITALS — BP 110/68 | HR 64 | Temp 97.7°F | Resp 16 | Ht 62.0 in | Wt 115.2 lb

## 2017-04-10 DIAGNOSIS — R2232 Localized swelling, mass and lump, left upper limb: Secondary | ICD-10-CM | POA: Diagnosis not present

## 2017-04-10 DIAGNOSIS — Z1231 Encounter for screening mammogram for malignant neoplasm of breast: Secondary | ICD-10-CM

## 2017-04-10 DIAGNOSIS — J302 Other seasonal allergic rhinitis: Secondary | ICD-10-CM

## 2017-04-10 DIAGNOSIS — E039 Hypothyroidism, unspecified: Secondary | ICD-10-CM

## 2017-04-10 DIAGNOSIS — Z0000 Encounter for general adult medical examination without abnormal findings: Secondary | ICD-10-CM | POA: Diagnosis not present

## 2017-04-10 DIAGNOSIS — Z1239 Encounter for other screening for malignant neoplasm of breast: Secondary | ICD-10-CM

## 2017-04-10 LAB — POC URINALSYSI DIPSTICK (AUTOMATED)
BILIRUBIN UA: NEGATIVE
Blood, UA: NEGATIVE
Glucose, UA: NEGATIVE
Ketones, UA: NEGATIVE
Leukocytes, UA: NEGATIVE
Nitrite, UA: NEGATIVE
PH UA: 6 (ref 5.0–8.0)
Protein, UA: NEGATIVE
Spec Grav, UA: 1.025 (ref 1.010–1.025)
UROBILINOGEN UA: 0.2 U/dL

## 2017-04-10 MED ORDER — PHENYLEPHRINE HCL 10 MG PO TABS: 10.0000 mg | ORAL_TABLET | ORAL | 0 refills | Status: DC | PRN

## 2017-04-10 MED ORDER — PHENYLEPHRINE HCL 10 MG PO TABS
10.0000 mg | ORAL_TABLET | ORAL | 0 refills | Status: DC | PRN
Start: 2017-04-10 — End: 2017-04-10

## 2017-04-10 MED ORDER — KETOTIFEN FUMARATE 0.025 % OP SOLN: 1.0000 [drp] | Freq: Two times a day (BID) | OPHTHALMIC | 0 refills | Status: DC

## 2017-04-10 NOTE — Progress Notes (Signed)
Pre visit review using our clinic review tool, if applicable. No additional management support is needed unless otherwise documented below in the visit note. 

## 2017-04-10 NOTE — Patient Instructions (Signed)

## 2017-04-10 NOTE — Progress Notes (Signed)
Patient ID: Kirsten Ramirez, female   DOB: 06-May-1971, 46 y.o.   MRN: [id number]   Subjective:     Kirsten Ramirez is a 46 y.o. female and is here for a comprehensive physical exam. The patient reports a lymph node on right axillary.   She also has dark spots on hands.  Thinks she may have been exposed to a lot of sun.  Left knee hurts and crunches sometimes.  It hurts sometimes when she goes up and down stairs.  Usually hurts about a half of day.  Has taken ibuprofen for pain.    Social History   Social History  . Marital status: Married    Spouse name: N/A  . Number of children: 2  . Years of education: N/A   Occupational History  . 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 Smoking status: Never Smoker  . Smokeless tobacco: Never Used  . Alcohol use 0.0 oz/week     Comment: only very rare and very little around holidays.  . Drug use: No  . Sexual activity: Yes    Partners: Male   Other Topics Concern  . Not on file   Social History Narrative   Exercise-- walks in am   Health Maintenance  Topic Date Due  . HIV Screening  09/14/1984  . MAMMOGRAM  02/19/2016  . INFLUENZA VACCINE  07/19/2017  . PAP SMEAR  04/06/2019  . TETANUS/TDAP  12/22/2022    The following portions of the patient's history were reviewed and updated as appropriate:  She  has a past medical history of Allergy and Thyroid disease. She  does not have any pertinent problems on file. She  has a past surgical history that includes Appendectomy (1992); Cesarean section (2009, 2011); and Tubal ligation. Her family history includes Cancer in her father; Hypertension in her father and mother. She  reports that she has never smoked. She has never used smokeless tobacco. She reports that she drinks alcohol. She reports that she does not use drugs. She has a current medication list which includes the following prescription(s): levothyroxine, ketotifen, and phenylephrine. Current Outpatient Prescriptions on File Prior to  Visit  Medication Sig Dispense Refill  . levothyroxine (SYNTHROID, LEVOTHROID) 50 MCG tablet Take 1 tablet (50 mcg total) by mouth daily. 90 tablet 3   No current facility-administered medications on file prior to visit.    She has No Known Allergies..  Review of Systems Review of Systems  Constitutional: Negative for activity change, appetite change and fatigue.  HENT: Negative for hearing loss, congestion, tinnitus and ear discharge.  dentist q26m Eyes: Negative for visual disturbance (see optho q1y -- vision corrected to 20/20 with glasses).  Respiratory: Negative for cough, chest tightness and shortness of breath.   Cardiovascular: Negative for chest pain, palpitations and leg swelling.  Gastrointestinal: Negative for abdominal pain, diarrhea, constipation and abdominal distention.  Genitourinary: Negative for urgency, frequency, decreased urine volume and difficulty urinating.  Musculoskeletal: Negative for back pain, arthralgias and gait problem.  Skin: Negative for color change, pallor and rash.  Has small mass 1 inch diameter under right axillary. Neurological: Negative for dizziness, light-headedness, numbness and headaches.  Hematological: Negative for adenopathy. Does not bruise/bleed easily.  Psychiatric/Behavioral: Negative for suicidal ideas, confusion, sleep disturbance, self-injury, dysphoric mood, decreased concentration and agitation.       Objective:    BP 110/68 (BP Location: Left Arm, Cuff Size: Normal)   Pulse 64   Temp 97.7  F (36.5 C) (Oral)   Resp 16   Ht  (1.575 m)   Wt 115 lb 3.2 oz (52.3 kg)   LMP 04/04/2017   SpO2 99%   BMI 21.07 kg/m  General appearance: alert, cooperative, appears stated age and no distress Head: Normocephalic, without obvious abnormality, atraumatic Eyes: conjunctivae/corneas clear. PERRL, EOM's intact. Fundi benign. Ears: normal TM's and external ear canals both ears Nose: Nares normal. Septum midline. Mucosa normal. No  drainage or sinus tenderness. Throat: lips, mucosa, and tongue normal; teeth and gums normal Neck: no adenopathy, no carotid bruit, no JVD, supple, symmetrical, trachea midline and thyroid not enlarged, symmetric, no tenderness/mass/nodules Lungs: clear to auscultation bilaterally Breasts: normal appearance, no masses or tenderness- ? R axillary lymph node Heart: regular rate and rhythm, S1, S2 normal, no murmur, click, rub or gallop Abdomen: soft, non-tender; bowel sounds normal; no masses,  no organomegaly Pelvic: cervix normal in appearance, external genitalia normal, no adnexal masses or tenderness, no cervical motion tenderness, rectovaginal septum normal, uterus normal size, shape, and consistency and vagina normal without discharge Extremities: extremities normal, atraumatic, no cyanosis or edema Pulses: 2+ and symmetric Skin: Skin color, texture, turgor normal. No rashes or lesions Lymph nodes: Cervical, supraclavicular, and axillary nodes normal. Neurologic: Alert and oriented X 3, normal strength and tone. Normal symmetric reflexes. Normal coordination and gait    Assessment:    Healthy female exam.      Plan:     See After Visit Summary for Counseling Recommendations   ghm utd Check labs 1. Preventative health care See above - POCT Urinalysis Dipstick (Automated) - CBC - Lipid panel - Comprehensive metabolic panel  2. Hypothyroidism, unspecified type Check labs - CBC - TSH  3. Seasonal allergic rhinitis, unspecified trigger   - ketotifen (ZADITOR) 0.025 % ophthalmic solution; Place 1 drop into both eyes 2 (two) times daily.  Dispense: 5 mL; Refill: 0 - phenylephrine (EQ SUPHEDRINE PE) 10 MG TABS tablet; Take 1 tablet (10 mg total) by mouth every 4 (four) hours as needed.  Dispense: 72 tablet; Refill: 0  4. Axillary mass, left   - MM Digital Diagnostic Unilat R; Future  5. Screening for breast cancer   - MM DIGITAL SCREENING BILATERAL; Future

## 2017-04-11 LAB — COMPREHENSIVE METABOLIC PANEL
ALT: 18 U/L (ref 0–35)
AST: 17 U/L (ref 0–37)
Albumin: 4.5 g/dL (ref 3.5–5.2)
Alkaline Phosphatase: 41 U/L (ref 39–117)
BUN: 13 mg/dL (ref 6–23)
CHLORIDE: 105 meq/L (ref 96–112)
CO2: 29 mEq/L (ref 19–32)
Calcium: 9.4 mg/dL (ref 8.4–10.5)
Creatinine, Ser: 0.67 mg/dL (ref 0.40–1.20)
GFR: 100.03 mL/min (ref >60.00)
GLUCOSE: 85 mg/dL (ref 70–99)
POTASSIUM: 4.7 meq/L (ref 3.5–5.1)
SODIUM: 140 meq/L (ref 135–145)
Total Bilirubin: 1 mg/dL (ref 0.2–1.2)
Total Protein: 6.9 g/dL (ref 6.0–8.3)

## 2017-04-11 LAB — LIPID PANEL
CHOLESTEROL: 160 mg/dL (ref 0–200)
HDL: 61.7 mg/dL (ref >39.00)
LDL CALC: 87 mg/dL (ref 0–99)
NONHDL: 98.21
Total CHOL/HDL Ratio: 3
Triglycerides: 54 mg/dL (ref 0.0–149.0)
VLDL: 10.8 mg/dL (ref 0.0–40.0)

## 2017-04-11 LAB — CBC
HEMATOCRIT: 40 % (ref 36.0–46.0)
HEMOGLOBIN: 13.2 g/dL (ref 12.0–15.0)
MCHC: 33.1 g/dL (ref 30.0–36.0)
MCV: 88 fl (ref 78.0–100.0)
Platelets: 260 10*3/uL (ref 150.0–400.0)
RBC: 4.54 Mil/uL (ref 3.87–5.11)
RDW: 13.3 % (ref 11.5–15.5)
WBC: 6.9 10*3/uL (ref 4.0–10.5)

## 2017-04-11 LAB — TSH: TSH: 0.55 u[IU]/mL (ref 0.35–4.50)

## 2017-04-17 ENCOUNTER — Telehealth: Payer: Self-pay | Admitting: Family Medicine

## 2017-04-17 NOTE — Telephone Encounter (Signed)
Left message on machine that labs were normal and to call back with any questions or concerns.  See labs.

## 2017-04-17 NOTE — Telephone Encounter (Signed)
Pt returning call for results   CB: [phone]

## 2017-05-04 ENCOUNTER — Other Ambulatory Visit: Payer: Self-pay | Admitting: Family Medicine

## 2017-05-04 MED ORDER — LEVOTHYROXINE SODIUM 50 MCG PO TABS: 50.0000 ug | ORAL_TABLET | Freq: Every day | ORAL | 3 refills | Status: DC

## 2017-05-04 NOTE — Telephone Encounter (Signed)
Refill thyroid medication to [facility] home delivery per pt request

## 2017-05-08 LAB — HM MAMMOGRAPHY

## 2017-05-17 ENCOUNTER — Encounter: Payer: Self-pay | Admitting: *Deleted

## 2017-05-26 ENCOUNTER — Telehealth: Payer: Self-pay | Admitting: Family Medicine

## 2017-05-29 NOTE — Telephone Encounter (Signed)
error:[id number] ° °

## 2017-06-12 ENCOUNTER — Telehealth: Payer: Self-pay | Admitting: Family Medicine

## 2017-06-12 NOTE — Telephone Encounter (Addendum)
Relation to ZO:XWRUpt:self Call back number:[phone]   Reason for call:  Patient states she received a bill for date of service 04/10/17 for her physical appointment, patient states insurance advised her to contact PCP requesting to re submit claim and to ensure TSH was submitted as preventive labs, please advise

## 2017-06-12 NOTE — Telephone Encounter (Signed)
Can we verify that this is tsh lab for 04/10/17 is coded correctly

## 2017-06-12 NOTE — Telephone Encounter (Signed)
Thanks I will notify the patient:    Left patient a message to call the office bach

## 2017-06-12 NOTE — Telephone Encounter (Signed)
HI Kirsten Ramirez,   Patient has hypothyroidism, therefore dx of hypothyroidism is correct, since she was checking TSH; this is not a screening since she has been diagnoses with condition.   Thanks, [employer]

## 2017-08-16 ENCOUNTER — Telehealth: Payer: Self-pay | Admitting: Family Medicine

## 2017-08-16 NOTE — Telephone Encounter (Signed)
Caller name: Teonna Relation to pt: self Call back number: [phone] Pharmacy:  Reason for call: Pt called stating [facility] that was done on  04-10-2017, pt informed that her provider Laury Axon([place]) on her [facility] appt (04-10-2017) stated was going to put an order to have her mammogram done as part of her regular visit from the cpe, but pt informed her provider that had a small lump under her arm and that provider mentioned was going to put a diagnose for mammogram but will have a regular mammogram order too, pt states that it look like both orders where combined and that is why the insurance is not covering it as part of CPE. Pt would like provider or billing person looked over the order and to make the adjustment as needed (as a regular mammogram so that her insurance will cover it completely). Please call pt to give her an update about the billing on her mammogram ASAP since [facility] was [facility] that the amount will be sent to the Collection Agency if not paid on time. Please advise. (Pt was given the billing department tel so that she can call them too)

## 2017-08-31 NOTE — Telephone Encounter (Signed)
Patient checking on the status of message below, please advise °

## 2017-12-22 ENCOUNTER — Telehealth: Payer: Self-pay | Admitting: Family Medicine

## 2017-12-22 NOTE — Telephone Encounter (Signed)
Copied from CRM [id number]. Topic: Quick Communication - Rx Refill/Question >> Dec 22, 2017  3:50 PM Landry MellowFoltz, Melissa J wrote: Has the patient contacted their pharmacy? No.   (Agent: If no, request that the patient contact the pharmacy for the refill.)   Preferred Pharmacy (with phone number or street name): pt needs refill on levothyroxine . Pt uses [facility]. Cb# [phone]    Agent: Please be advised that RX refills may take up to 3 business days. We ask that you follow-up with your pharmacy.

## 2017-12-23 ENCOUNTER — Encounter: Payer: Self-pay | Admitting: Family

## 2017-12-23 ENCOUNTER — Ambulatory Visit: Payer: Managed Care, Other (non HMO) | Admitting: Family

## 2017-12-23 VITALS — BP 96/62 | HR 80 | Temp 97.5°F | Wt 118.0 lb

## 2017-12-23 DIAGNOSIS — J029 Acute pharyngitis, unspecified: Secondary | ICD-10-CM | POA: Diagnosis not present

## 2017-12-23 LAB — POCT RAPID STREP A (OFFICE): Rapid Strep A Screen: NEGATIVE

## 2017-12-23 MED ORDER — AMOXICILLIN 875 MG PO TABS: 875.0000 mg | ORAL_TABLET | Freq: Two times a day (BID) | ORAL | 0 refills | Status: DC

## 2017-12-23 NOTE — Progress Notes (Signed)
Kirsten Ramirez is a 47 y.o. female with the following history as recorded in EpicCare:  Patient Active Problem List   Diagnosis Date Noted  . Pelvic pain in female 09/25/2015  . Wellness examination 01/02/2015  . Hypothyroidism 12/29/2014  . 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 levothyroxine (SYNTHROID, LEVOTHROID) 50 MCG tablet Take 1 tablet (50 mcg total) by mouth daily. 90 tablet 3  . amoxicillin (AMOXIL) 875 MG tablet Take 1 tablet (875 mg total) by mouth 2 (two) times daily. 20 tablet 0  . amoxicillin (AMOXIL) 875 MG tablet Take 1 tablet (875 mg total) by mouth 2 (two) times daily. 20 tablet 0   No current facility-administered medications for this visit.     Allergies: Patient has no known allergies.  Past Medical History:  Diagnosis Date  . Allergy   . Thyroid disease     Past Surgical History:  Procedure Laterality Date  . APPENDECTOMY  1992  . CESAREAN SECTION  2009, 2011  . TUBAL LIGATION      Family History  Problem Relation Age of Onset  . Hypertension Mother   . Hypertension Father   . Cancer Father        colon, liver    Social History   Tobacco Use  . Smoking status: Never Smoker  . Smokeless tobacco: Never Used  Substance Use Topics  . Alcohol use: Yes    Alcohol/week: 0.0 oz    Comment: only very rare and very little around holidays.    Subjective:  Patient presents with concerns for possible strep; both children and husband have been treated for strep; notes that she is concerned because her son's strep test in the office was negative and throat culture was positive; + sore throat; + headache; + bilateral ear pain- has noticed some swelling beside both of her ears.    Objective:  Vitals:   12/23/17 1239  BP: 96/62  Pulse: 80  Temp: (!) 97.5 F (36.4 C)  TempSrc: Oral  SpO2: 95%  Weight: 118 lb (53.5 kg)    General: Well developed, well nourished, in no acute distress  Skin : Warm and dry.   Head: Normocephalic and atraumatic  Eyes: Sclera and conjunctiva clear; pupils round and reactive to light; extraocular movements intact  Ears: External normal; canals clear; tympanic membranes normal  Oropharynx: Pink, supple. No suspicious lesions  Neck: Supple without thyromegaly, bilateral cervical adenopathy Lungs: Respirations unlabored; clear to auscultation bilaterally without wheeze, rales, rhonchi  CVS exam: normal rate and regular rhythm.  Neurologic: Alert and oriented; speech intact; face symmetrical; moves all extremities well; CNII-XII intact without focal deficit   Assessment:  1. Sore throat     Plan:  Rapid strep in office is negative; will go ahead and treat due to concerns for family member's having negative rapid strep tests with positive culture; am unable to get culture done today as no lab is available; Rx for Amoxicillin 875 mg bid x 10 days; change toothbrush; follow-up worse, no better.   No Follow-up on file.  Orders Placed This Encounter  Procedures  . POC Rapid Strep A    Requested Prescriptions   Signed Prescriptions Disp Refills  . amoxicillin (AMOXIL) 875 MG tablet 20 tablet 0    Sig: Take 1 tablet (875 mg total) by mouth 2 (two) times daily.  Marland Kitchen. amoxicillin (AMOXIL) 875 MG tablet 20 tablet 0    Sig: Take 1 tablet (875 mg total)  by mouth 2 (two) times daily.

## 2017-12-25 MED ORDER — LEVOTHYROXINE SODIUM 50 MCG PO TABS: 50.0000 ug | ORAL_TABLET | Freq: Every day | ORAL | 0 refills | Status: DC

## 2017-12-25 NOTE — Telephone Encounter (Signed)
[facility]

## 2017-12-25 NOTE — Telephone Encounter (Signed)
Last OV 04/10/17.  Next OV 04/12/18.

## 2018-01-18 ENCOUNTER — Ambulatory Visit: Payer: Self-pay | Admitting: *Deleted

## 2018-01-18 NOTE — Telephone Encounter (Signed)
Pt reports left thumb with mild swelling and small hard "bump" between nail and joint area. Nodule is tender to touch, no open areas. Does not recall an injury but "I may have bumped it." No redness, no fever, ROM WNL. Home care advice given per protocol; pt instructed to call back if symptoms worsen, not resolved within 7 days. Reason for Disposition . Finger pain  Answer Assessment - Initial Assessment Questions 1. ONSET: "When did the pain start?"      01/16/18 2. LOCATION and RADIATION: "Where is the pain located?"  (e.g., fingertip, around nail, joint, entire  finger)      Left thumb, between nail and joint 3. SEVERITY: "How bad is the pain?" "What does it keep you from doing?"   (Scale 1-10; or mild, moderate, severe)  - MILD - doesn't interfere with normal activities   - MODERATE - interferes with normal activities or awakens from sleep  - SEVERE - excruciating pain, unable to hold a glass of water or bend finger even a little     mild 4. APPEARANCE: "What does the finger look like?" (e.g., redness, swelling, bruising, pallor)    "Just mild swelling."  "Hard bump between nail and joint." 5. WORK OR EXERCISE: "Has there been any recent work or exercise that involved this part of the body?"     Not sure 6. CAUSE: "What do you think is causing the pain?"     Unsure 7. AGGRAVATING FACTORS: "What makes the pain worse?" (e.g., using computer)     "Bump is tender." 8. OTHER SYMPTOMS: "Do you have any other symptoms?" (e.g., fever, neck pain, numbness)     no 9. PREGNANCY: "Is there any chance you are pregnant?" "When was your last menstrual period?"     no  Protocols used: FINGER PAIN-A-AH

## 2018-01-25 ENCOUNTER — Encounter: Payer: Self-pay | Admitting: Internal Medicine

## 2018-01-25 ENCOUNTER — Ambulatory Visit: Payer: Managed Care, Other (non HMO) | Admitting: Internal Medicine

## 2018-01-25 VITALS — BP 100/67 | HR 66 | Temp 98.5°F | Resp 16 | Ht 62.0 in | Wt 117.6 lb

## 2018-01-25 DIAGNOSIS — J029 Acute pharyngitis, unspecified: Secondary | ICD-10-CM

## 2018-01-25 DIAGNOSIS — J020 Streptococcal pharyngitis: Secondary | ICD-10-CM

## 2018-01-25 LAB — POCT RAPID STREP A (OFFICE): Rapid Strep A Screen: POSITIVE — AB

## 2018-01-25 MED ORDER — AMOXICILLIN 875 MG PO TABS: 875.0000 mg | ORAL_TABLET | Freq: Two times a day (BID) | ORAL | 0 refills | Status: DC

## 2018-01-25 NOTE — Progress Notes (Signed)
Subjective:    Patient ID: Kirsten Ramirez, female    DOB: 25-May-1971, 47 y.o.   MRN: [id number]  DOS:  01/25/2018 Type of visit - description : Acute visit Interval history: Symptoms started 2 days ago with sore throat, mild tenderness at the anterior glands of the neck. Had similar symptoms a few weeks ago, was treated with amoxicillin, got better.   Review of Systems Has fever chills No nausea or vomiting No myalgias Some postnasal dripping and cough on and off, that is actually going on for a while.  Past Medical History:  Diagnosis Date  . Allergy   . Thyroid disease     Past Surgical History:  Procedure Laterality Date  . APPENDECTOMY  1992  . CESAREAN SECTION  2009, 2011  . TUBAL LIGATION      Social History   Socioeconomic History  . Marital status: Married    Spouse name: Not on file  . Number of children: 2  . Years of education: Not on file  . Highest education level: Not on file  Social Needs  . Financial resource strain: Not on file  . Food insecurity - worry: Not on file  . Food insecurity - inability: Not on file  . Transportation needs - medical: Not on file  . Transportation needs - non-medical: Not on file  Occupational History  . 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 Smoking status: Never Smoker  . Smokeless tobacco: Never Used  Substance and Sexual Activity  . Alcohol use: Yes    Alcohol/week: 0.0 oz    Comment: only very rare and very little around holidays.  . Drug use: No  . Sexual activity: Yes    Partners: Male  Other Topics Concern  . Not on file  Social History Narrative   Exercise-- walks in am      Allergies as of 01/25/2018   No Known Allergies     Medication List        Accurate as of 01/25/18 10:19 PM. Always use your most recent med list.          amoxicillin 875 MG tablet Commonly known as:  AMOXIL Take 1 tablet (875 mg total) by mouth 2 (two) times daily.   levothyroxine 50 MCG tablet Commonly  known as:  SYNTHROID, LEVOTHROID Take 1 tablet (50 mcg total) by mouth daily.          Objective:   Physical Exam BP 100/67 (BP Location: Left Arm, Patient Position: Sitting, Cuff Size: Small)   Pulse 66   Temp 98.5 F (36.9 C) (Oral)   Resp 16   Ht 5\' 2"  (1.575 m)   Wt 117 lb 9.6 oz (53.3 kg)   SpO2 100%   BMI 21.51 kg/m   General:   Well developed, well nourished . NAD.  HEENT:  Normocephalic . Face symmetric, atraumatic.  TMs normal.  Throat: Slightly red, symmetric, no white patches, tonsils not enlarged. Neck: Few submandibular LADs, slightly TTP Lungs:  CTA B Normal respiratory effort, no intercostal retractions, no accessory muscle use. Heart: RRR,  no murmur.  No pretibial edema bilaterally  Skin: Not pale. Not jaundice Neurologic:  alert & oriented X3.  Speech normal, gait appropriate for age and unassisted Psych--  Cognition and judgment appear intact.  Cooperative with normal attention span and concentration.  Behavior appropriate. No anxious or depressed appearing.      Assessment & Plan:   47 year old female with history  of hypothyroidism presents with Pharyngitis: Developed sore throat 2 days ago,   exam is not impressive but strep test positive. On chart review, she has been positive before. Plan: amoxicillin, supportive care,  come back in 3 weeks for a throat culture, like to document cure. Mild cough, sinus congestion, postnasal dripping: Recommend Flonase. Also, reports some swelling at the R thumb PIP x few days , area is examined, no redness, range of motion normal, DIP is slightly swollen mostly at the inner aspect, cyst?  Recommend observation for now, call in a couple of weeks if not better, ortho referral?

## 2018-01-25 NOTE — Patient Instructions (Addendum)
Come back in 3 weeks , to the lab, get a THROAT CULTURE to be sure the infection is gone  =====  Rest, fluids , tylenol  If  cough:  Take Mucinex DM twice a day as needed until better  For nasal congestion: Use OTC   Flonase : 2 nasal sprays on each side of the nose in the morning until you feel better    Take the antibiotic as prescribed  (Amoxicillin)  Call if not gradually better over the next  10 days  Call anytime if the symptoms are severe

## 2018-02-15 ENCOUNTER — Other Ambulatory Visit (INDEPENDENT_AMBULATORY_CARE_PROVIDER_SITE_OTHER): Payer: Managed Care, Other (non HMO)

## 2018-02-15 DIAGNOSIS — J029 Acute pharyngitis, unspecified: Secondary | ICD-10-CM | POA: Diagnosis not present

## 2018-02-15 NOTE — Addendum Note (Signed)
Addended by: Harley AltoPRICE, Madolyn Ackroyd M on: 02/15/2018 09:15 AM   Modules accepted: Orders

## 2018-02-17 LAB — CULTURE, GROUP A STREP
MICRO NUMBER: 90262219
SPECIMEN QUALITY:: ADEQUATE

## 2018-04-12 ENCOUNTER — Encounter: Payer: Managed Care, Other (non HMO) | Admitting: Family Medicine

## 2018-04-16 ENCOUNTER — Other Ambulatory Visit: Payer: Self-pay | Admitting: Family Medicine

## 2018-05-31 ENCOUNTER — Encounter: Payer: Self-pay | Admitting: Family Medicine

## 2018-05-31 ENCOUNTER — Ambulatory Visit (INDEPENDENT_AMBULATORY_CARE_PROVIDER_SITE_OTHER): Payer: Managed Care, Other (non HMO) | Admitting: Family Medicine

## 2018-05-31 ENCOUNTER — Encounter

## 2018-05-31 VITALS — BP 110/76 | HR 68 | Temp 98.4°F | Resp 18 | Ht 61.0 in | Wt 114.2 lb

## 2018-05-31 DIAGNOSIS — R109 Unspecified abdominal pain: Secondary | ICD-10-CM | POA: Diagnosis not present

## 2018-05-31 DIAGNOSIS — Z0000 Encounter for general adult medical examination without abnormal findings: Secondary | ICD-10-CM

## 2018-05-31 DIAGNOSIS — M25561 Pain in right knee: Secondary | ICD-10-CM | POA: Diagnosis not present

## 2018-05-31 DIAGNOSIS — R319 Hematuria, unspecified: Secondary | ICD-10-CM

## 2018-05-31 DIAGNOSIS — M2550 Pain in unspecified joint: Secondary | ICD-10-CM | POA: Diagnosis not present

## 2018-05-31 DIAGNOSIS — M25562 Pain in left knee: Secondary | ICD-10-CM | POA: Diagnosis not present

## 2018-05-31 LAB — POC URINALSYSI DIPSTICK (AUTOMATED)
BILIRUBIN UA: NEGATIVE
GLUCOSE UA: NEGATIVE
Ketones, UA: NEGATIVE
Leukocytes, UA: NEGATIVE
NITRITE UA: NEGATIVE
Protein, UA: NEGATIVE
Spec Grav, UA: 1.025 (ref 1.010–1.025)
Urobilinogen, UA: 0.2 E.U./dL
pH, UA: 6 (ref 5.0–8.0)

## 2018-05-31 NOTE — Patient Instructions (Signed)
Preventive Care 40-64 Years, Female Preventive care refers to lifestyle choices and visits with your health care provider that can promote health and wellness. What does preventive care include?  A yearly physical exam. This is also called an annual well check.  Dental exams once or twice a year.  Routine eye exams. Ask your health care provider how often you should have your eyes checked.  Personal lifestyle choices, including: ? Daily care of your teeth and gums. ? Regular physical activity. ? Eating a healthy diet. ? Avoiding tobacco and drug use. ? Limiting alcohol use. ? Practicing safe sex. ? Taking low-dose aspirin daily starting at age 58. ? Taking vitamin and mineral supplements as recommended by your health care provider. What happens during an annual well check? The services and screenings done by your health care provider during your annual well check will depend on your age, overall health, lifestyle risk factors, and family history of disease. Counseling Your health care provider may ask you questions about your:  Alcohol use.  Tobacco use.  Drug use.  Emotional well-being.  Home and relationship well-being.  Sexual activity.  Eating habits.  Work and work Statistician.  Method of birth control.  Menstrual cycle.  Pregnancy history.  Screening You may have the following tests or measurements:  Height, weight, and BMI.  Blood pressure.  Lipid and cholesterol levels. These may be checked every 5 years, or more frequently if you are over 81 years old.  Skin check.  Lung cancer screening. You may have this screening every year starting at age 78 if you have a 30-pack-year history of smoking and currently smoke or have quit within the past 15 years.  Fecal occult blood test (FOBT) of the stool. You may have this test every year starting at age 65.  Flexible sigmoidoscopy or colonoscopy. You may have a sigmoidoscopy every 5 years or a colonoscopy  every 10 years starting at age 30.  Hepatitis C blood test.  Hepatitis B blood test.  Sexually transmitted disease (STD) testing.  Diabetes screening. This is done by checking your blood sugar (glucose) after you have not eaten for a while (fasting). You may have this done every 1-3 years.  Mammogram. This may be done every 1-2 years. Talk to your health care provider about when you should start having regular mammograms. This may depend on whether you have a family history of breast cancer.  BRCA-related cancer screening. This may be done if you have a family history of breast, ovarian, tubal, or peritoneal cancers.  Pelvic exam and Pap test. This may be done every 3 years starting at age 80. Starting at age 36, this may be done every 5 years if you have a Pap test in combination with an HPV test.  Bone density scan. This is done to screen for osteoporosis. You may have this scan if you are at high risk for osteoporosis.  Discuss your test results, treatment options, and if necessary, the need for more tests with your health care provider. Vaccines Your health care provider may recommend certain vaccines, such as:  Influenza vaccine. This is recommended every year.  Tetanus, diphtheria, and acellular pertussis (Tdap, Td) vaccine. You may need a Td booster every 10 years.  Varicella vaccine. You may need this if you have not been vaccinated.  Zoster vaccine. You may need this after age 5.  Measles, mumps, and rubella (MMR) vaccine. You may need at least one dose of MMR if you were born in  1957 or later. You may also need a second dose.  Pneumococcal 13-valent conjugate (PCV13) vaccine. You may need this if you have certain conditions and were not previously vaccinated.  Pneumococcal polysaccharide (PPSV23) vaccine. You may need one or two doses if you smoke cigarettes or if you have certain conditions.  Meningococcal vaccine. You may need this if you have certain  conditions.  Hepatitis A vaccine. You may need this if you have certain conditions or if you travel or work in places where you may be exposed to hepatitis A.  Hepatitis B vaccine. You may need this if you have certain conditions or if you travel or work in places where you may be exposed to hepatitis B.  Haemophilus influenzae type b (Hib) vaccine. You may need this if you have certain conditions.  Talk to your health care provider about which screenings and vaccines you need and how often you need them. This information is not intended to replace advice given to you by your health care provider. Make sure you discuss any questions you have with your health care provider. Document Released: 01/01/2016 Document Revised: 08/24/2016 Document Reviewed: 10/06/2015 Elsevier Interactive Patient Education  2018 Elsevier Inc.  

## 2018-05-31 NOTE — Progress Notes (Signed)
Subjective:  I acted as a Neurosurgeon for [employer]. Fuller Song, RMA   Patient ID: Kirsten Ramirez, female    DOB: [birth year], 47 y.o.   MRN: [id number]  Chief Complaint  Patient presents with  . Annual Exam    HPI  Patient is in today for annual exam.  She c/o joint pains--- mostly knees and back .  Aleve helps     Patient Care Team: Zola Button, Grayling Congress, DO as PCP - General (Family Medicine)   Past Medical History:  Diagnosis Date  . Allergy   . Thyroid disease     Past Surgical History:  Procedure Laterality Date  . APPENDECTOMY  1992  . CESAREAN SECTION  2009, 2011  . TUBAL LIGATION      Family History  Problem Relation Age of Onset  . Hypertension Mother   . Hypertension Father   . Cancer Father        colon, liver    Social History   Socioeconomic History  . Marital status: Married    Spouse name: Not on file  . Number of children: 2  . Years of education: Not on file  . Highest education level: Not on file  Occupational History  . 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 Financial resource strain: Not on file  . Food insecurity:    Worry: Not on file    Inability: Not on file  . Transportation needs:    Medical: Not on file    Non-medical: Not on file  Tobacco Use  . Smoking status: Never Smoker  . Smokeless tobacco: Never Used  Substance and Sexual Activity  . Alcohol use: Yes    Alcohol/week: 0.0 oz    Comment: only very rare and very little around holidays.  . Drug use: No  . Sexual activity: Yes    Partners: Male  Lifestyle  . Physical activity:    Days per week: Not on file    Minutes per session: Not on file  . Stress: Not on file  Relationships  . Social connections:    Talks on phone: Not on file    Gets together: Not on file    Attends religious service: Not on file    Active member of club or organization: Not on file    Attends meetings of clubs or organizations: Not on file    Relationship status: Not on file  .  Intimate partner violence:    Fear of current or ex partner: Not on file    Emotionally abused: Not on file    Physically abused: Not on file    Forced sexual activity: Not on file  Other Topics Concern  . Not on file  Social History Narrative   Exercise-- walks in am    Outpatient Medications Prior to Visit  Medication Sig Dispense Refill  . levothyroxine (SYNTHROID, LEVOTHROID) 50 MCG tablet TAKE 1 TABLET BY MOUTH DAILY 90 tablet 0  . amoxicillin (AMOXIL) 875 MG tablet Take 1 tablet (875 mg total) by mouth 2 (two) times daily. 20 tablet 0   No facility-administered medications prior to visit.     No Known Allergies  Review of Systems  Constitutional: Negative for chills, fever and malaise/fatigue.  HENT: Negative for congestion and hearing loss.   Eyes: Negative for discharge.  Respiratory: Negative for cough, sputum production and shortness of breath.   Cardiovascular: Negative for chest pain, palpitations and leg swelling.  Gastrointestinal: Negative for  abdominal pain, blood in stool, constipation, diarrhea, heartburn, nausea and vomiting.  Genitourinary: Negative for dysuria, frequency, hematuria and urgency.  Musculoskeletal: Positive for joint pain and myalgias. Negative for back pain and falls.  Skin: Negative for rash.  Neurological: Negative for dizziness, sensory change, loss of consciousness, weakness and headaches.  Endo/Heme/Allergies: Negative for environmental allergies. Does not bruise/bleed easily.  Psychiatric/Behavioral: Negative for depression and suicidal ideas. The patient is not nervous/anxious and does not have insomnia.        Objective:    Physical Exam  Constitutional: She is oriented to person, place, and time. She appears well-developed and well-nourished. No distress.  HENT:  Right Ear: External ear normal.  Left Ear: External ear normal.  Nose: Nose normal.  Mouth/Throat: Oropharynx is clear and moist.  Eyes: Pupils are equal, round, and  reactive to light. EOM are normal.  Neck: Normal range of motion. Neck supple.  Cardiovascular: Normal rate, regular rhythm and normal heart sounds.  No murmur heard. Pulmonary/Chest: Effort normal and breath sounds normal. No respiratory distress. She has no wheezes. She has no rales. She exhibits no tenderness.  Neurological: She is alert and oriented to person, place, and time.  Psychiatric: She has a normal mood and affect. Her behavior is normal. Judgment and thought content normal.  Nursing note and vitals reviewed.   BP 110/76 (BP Location: Left Arm, Patient Position: Sitting, Cuff Size: Normal)   Pulse 68   Temp 98.4 F (36.9 C) (Oral)   Resp 18   Ht 5\' 1"  (1.549 m)   Wt 114 lb 3.2 oz (51.8 kg)   SpO2 99%   BMI 21.58 kg/m  Wt Readings from Last 3 Encounters:  05/31/18 114 lb 3.2 oz (51.8 kg)  01/25/18 117 lb 9.6 oz (53.3 kg)  12/23/17 118 lb (53.5 kg)   BP Readings from Last 3 Encounters:  05/31/18 110/76  01/25/18 100/67  12/23/17 96/62     Immunization History  Administered Date(s) Administered  . Influenza,inj,Quad PF,6+ Mos 01/02/2015  . Td 12/22/2012    Health Maintenance  Topic Date Due  . HIV Screening  09/14/1984  . MAMMOGRAM  05/08/2018  . INFLUENZA VACCINE  07/19/2018  . PAP SMEAR  04/06/2019  . TETANUS/TDAP  12/22/2022    Lab Results  Component Value Date   WBC 2.6 (L) 05/31/2018   HGB 12.8 05/31/2018   HCT 37.7 05/31/2018   PLT 162.0 05/31/2018   GLUCOSE 84 05/31/2018   CHOL 133 05/31/2018   TRIG 48.0 05/31/2018   HDL 58.70 05/31/2018   LDLCALC 65 05/31/2018   ALT 17 05/31/2018   AST 16 05/31/2018   NA 139 05/31/2018   K 4.1 05/31/2018   CL 104 05/31/2018   CREATININE 0.64 05/31/2018   BUN 12 05/31/2018   CO2 27 05/31/2018   TSH 0.78 05/31/2018    Lab Results  Component Value Date   TSH 0.78 05/31/2018   Lab Results  Component Value Date   WBC 2.6 (L) 05/31/2018   HGB 12.8 05/31/2018   HCT 37.7 05/31/2018   MCV 88.5  05/31/2018   PLT 162.0 05/31/2018   Lab Results  Component Value Date   NA 139 05/31/2018   K 4.1 05/31/2018   CO2 27 05/31/2018   GLUCOSE 84 05/31/2018   BUN 12 05/31/2018   CREATININE 0.64 05/31/2018   BILITOT 0.6 05/31/2018   ALKPHOS 39 05/31/2018   AST 16 05/31/2018   ALT 17 05/31/2018   PROT 6.5 05/31/2018  ALBUMIN 4.2 05/31/2018   CALCIUM 8.8 05/31/2018   GFR 104.95 05/31/2018   Lab Results  Component Value Date   CHOL 133 05/31/2018   Lab Results  Component Value Date   HDL 58.70 05/31/2018   Lab Results  Component Value Date   LDLCALC 65 05/31/2018   Lab Results  Component Value Date   TRIG 48.0 05/31/2018   Lab Results  Component Value Date   CHOLHDL 2 05/31/2018   No results found for: HGBA1C       Assessment & Plan:   Problem List Items Addressed This Visit      Unprioritized   Preventative health care - Primary    ghm utd Check labs See AVs      Relevant Orders   CBC with Differential/Platelet (Completed)   Comprehensive metabolic panel (Completed)   Lipid panel (Completed)   TSH (Completed)   POCT Urinalysis Dipstick (Automated) (Completed)    Other Visit Diagnoses    Arthralgia of both knees       Relevant Orders   Vitamin D (25 hydroxy) (Completed)   Rheumatoid Factor (Completed)   Sedimentation rate (Completed)   Antinuclear Antib (ANA)   Flank pain       Arthralgia, unspecified joint       Relevant Orders   Rheumatoid Factor (Completed)   Sedimentation rate (Completed)   Antinuclear Antib (ANA)   Hematuria, unspecified type       Relevant Orders   Urine Culture (Completed)      I have discontinued Kirsten Ramirez's amoxicillin. I am also having her maintain her levothyroxine.  No orders of the defined types were placed in this encounter.   CMA served as Neurosurgeonscribe during this visit. History, Physical and Plan performed by medical provider. Documentation and orders reviewed and attested to.  Donato SchultzYvonne R Lowne Chase, DO

## 2018-06-01 LAB — URINE CULTURE
MICRO NUMBER:: 90710573
SPECIMEN QUALITY:: ADEQUATE

## 2018-06-01 LAB — COMPREHENSIVE METABOLIC PANEL
ALK PHOS: 39 U/L (ref 39–117)
ALT: 17 U/L (ref 0–35)
AST: 16 U/L (ref 0–37)
Albumin: 4.2 g/dL (ref 3.5–5.2)
BUN: 12 mg/dL (ref 6–23)
CALCIUM: 8.8 mg/dL (ref 8.4–10.5)
CO2: 27 mEq/L (ref 19–32)
CREATININE: 0.64 mg/dL (ref 0.40–1.20)
Chloride: 104 mEq/L (ref 96–112)
GFR: 104.95 mL/min (ref >60.00)
Glucose, Bld: 84 mg/dL (ref 70–99)
POTASSIUM: 4.1 meq/L (ref 3.5–5.1)
Sodium: 139 mEq/L (ref 135–145)
TOTAL PROTEIN: 6.5 g/dL (ref 6.0–8.3)
Total Bilirubin: 0.6 mg/dL (ref 0.2–1.2)

## 2018-06-01 LAB — CBC WITH DIFFERENTIAL/PLATELET
BASOS ABS: 0 10*3/uL (ref 0.0–0.1)
BASOS PCT: 1 % (ref 0.0–3.0)
EOS ABS: 0.1 10*3/uL (ref 0.0–0.7)
Eosinophils Relative: 1.9 % (ref 0.0–5.0)
HCT: 37.7 % (ref 36.0–46.0)
Hemoglobin: 12.8 g/dL (ref 12.0–15.0)
LYMPHS ABS: 0.7 10*3/uL (ref 0.7–4.0)
Lymphocytes Relative: 28 % (ref 12.0–46.0)
MCHC: 33.9 g/dL (ref 30.0–36.0)
MCV: 88.5 fl (ref 78.0–100.0)
MONOS PCT: 15 % — AB (ref 3.0–12.0)
Monocytes Absolute: 0.4 10*3/uL (ref 0.1–1.0)
Neutro Abs: 1.4 10*3/uL (ref 1.4–7.7)
Neutrophils Relative %: 54.1 % (ref 43.0–77.0)
Platelets: 162 10*3/uL (ref 150.0–400.0)
RBC: 4.26 Mil/uL (ref 3.87–5.11)
RDW: 13.3 % (ref 11.5–15.5)
WBC: 2.6 10*3/uL — ABNORMAL LOW (ref 4.0–10.5)

## 2018-06-01 LAB — LIPID PANEL
CHOLESTEROL: 133 mg/dL (ref 0–200)
HDL: 58.7 mg/dL (ref >39.00)
LDL Cholesterol: 65 mg/dL (ref 0–99)
NonHDL: 74.76
Total CHOL/HDL Ratio: 2
Triglycerides: 48 mg/dL (ref 0.0–149.0)
VLDL: 9.6 mg/dL (ref 0.0–40.0)

## 2018-06-01 LAB — VITAMIN D 25 HYDROXY (VIT D DEFICIENCY, FRACTURES): VITD: 37.06 ng/mL (ref 30.00–100.00)

## 2018-06-01 LAB — TSH: TSH: 0.78 u[IU]/mL (ref 0.35–4.50)

## 2018-06-01 LAB — SEDIMENTATION RATE: Sed Rate: 1 mm/hr (ref 0–20)

## 2018-06-02 DIAGNOSIS — Z0000 Encounter for general adult medical examination without abnormal findings: Secondary | ICD-10-CM | POA: Insufficient documentation

## 2018-06-02 HISTORY — DX: Encounter for general adult medical examination without abnormal findings: Z00.00

## 2018-06-02 NOTE — Assessment & Plan Note (Signed)
ghm utd Check labs  See AVs  

## 2018-06-03 LAB — RHEUMATOID FACTOR: Rhuematoid fact SerPl-aCnc: <14 IU/mL (ref <14)

## 2018-06-03 LAB — ANA: ANA: NEGATIVE

## 2018-06-06 ENCOUNTER — Other Ambulatory Visit: Payer: Self-pay | Admitting: *Deleted

## 2018-06-06 DIAGNOSIS — D72819 Decreased white blood cell count, unspecified: Secondary | ICD-10-CM

## 2018-06-07 ENCOUNTER — Other Ambulatory Visit: Payer: Self-pay | Admitting: Family Medicine

## 2018-06-07 DIAGNOSIS — Z1231 Encounter for screening mammogram for malignant neoplasm of breast: Secondary | ICD-10-CM

## 2018-06-09 ENCOUNTER — Encounter (HOSPITAL_BASED_OUTPATIENT_CLINIC_OR_DEPARTMENT_OTHER): Payer: Self-pay

## 2018-06-09 ENCOUNTER — Ambulatory Visit (HOSPITAL_BASED_OUTPATIENT_CLINIC_OR_DEPARTMENT_OTHER)
Admission: RE | Admit: 2018-06-09 | Discharge: 2018-06-09 | Disposition: A | Discharge status: Home / Self Care | Payer: Managed Care, Other (non HMO) | Source: Ambulatory Visit | Attending: Family Medicine | Admitting: Family Medicine

## 2018-06-09 DIAGNOSIS — Z1231 Encounter for screening mammogram for malignant neoplasm of breast: Secondary | ICD-10-CM | POA: Insufficient documentation

## 2018-06-28 ENCOUNTER — Telehealth: Payer: Self-pay | Admitting: *Deleted

## 2018-06-28 NOTE — Telephone Encounter (Signed)
Copied from CRM [phone]. Topic: General - Other >> Jun 28, 2018 12:52 PM Trula SladeWalter, Linda F wrote: Reason for CRM:  Patient would like to know why her Vitamin D was tested.  She received something from her insurance company asking if it was medically necessary.  Please advise.  Please call [phone].

## 2018-07-03 NOTE — Telephone Encounter (Signed)
yes

## 2018-07-03 NOTE — Telephone Encounter (Signed)
Was the vitamin d done because of pain.

## 2018-07-04 NOTE — Telephone Encounter (Signed)
Left detailed message that test was medically necessary because at office she was having pains and vitamin d can sometime cause that symptom.

## 2018-07-10 ENCOUNTER — Telehealth: Payer: Self-pay | Admitting: Family Medicine

## 2018-07-10 NOTE — Telephone Encounter (Signed)
Copied from CRM [phone]. Topic: Quick Communication - Rx Refill/Question >> Jul 10, 2018 12:09 PM Mickel BaasMcGee, Kieu Quiggle B, NT wrote: Medication: levothyroxine (SYNTHROID, LEVOTHROID) 50 MCG tablet  Has the patient contacted their pharmacy? Yes.   (Agent: If no, request that the patient contact the pharmacy for the refill.) (Agent: If yes, when and what did the pharmacy advise?)  Preferred Pharmacy (with phone number or street name): [facility] - [place], [place] - [id number]  Agent: Please be advised that RX refills may take up to 3 business days. We ask that you follow-up with your pharmacy.   **Requesting enough refills for a year**

## 2018-07-11 ENCOUNTER — Other Ambulatory Visit: Payer: Self-pay

## 2018-07-11 MED ORDER — LEVOTHYROXINE SODIUM 50 MCG PO TABS: 50.0000 ug | ORAL_TABLET | Freq: Every day | ORAL | 0 refills | Status: DC

## 2018-08-31 ENCOUNTER — Other Ambulatory Visit: Payer: Managed Care, Other (non HMO)

## 2018-09-04 ENCOUNTER — Other Ambulatory Visit: Payer: Self-pay | Admitting: Family Medicine

## 2018-09-27 ENCOUNTER — Ambulatory Visit: Payer: Managed Care, Other (non HMO) | Admitting: Family Medicine

## 2018-11-06 ENCOUNTER — Telehealth: Payer: Self-pay | Admitting: *Deleted

## 2018-11-06 NOTE — Telephone Encounter (Signed)
Copied from CRM [phone]. Topic: General - Other >> Nov 06, 2018  1:39 PM Percival SpanishKennedy, Cheryl W wrote:  Pt said she has access breast tissue and is asking if she need to see a Obgyn

## 2018-11-07 NOTE — Telephone Encounter (Signed)
Access

## 2018-11-09 NOTE — Telephone Encounter (Signed)
She can be seen here first --- how bad is it?  If it can not wait until next week ---there is sat clinic.

## 2018-11-09 NOTE — Telephone Encounter (Signed)
Abcess 

## 2018-11-14 NOTE — Telephone Encounter (Signed)
Author phoned pt. To f/u reported breast abscess. No answer, author left detailed VM to return call if requesting appointment.

## 2018-11-27 ENCOUNTER — Encounter: Payer: Self-pay | Admitting: Family Medicine

## 2018-11-27 ENCOUNTER — Ambulatory Visit: Payer: Managed Care, Other (non HMO) | Admitting: Family Medicine

## 2018-11-27 VITALS — BP 100/60 | HR 73 | Temp 98.0°F | Resp 16 | Ht 61.0 in | Wt 116.8 lb

## 2018-11-27 DIAGNOSIS — R2233 Localized swelling, mass and lump, upper limb, bilateral: Secondary | ICD-10-CM

## 2018-11-27 NOTE — Patient Instructions (Signed)
Breast Ultrasound Breast ultrasound, or breast ultrasonogram, is a procedure that is used to check for breast problems. A breast ultrasound uses harmless sound waves and a computer to create pictures of the inside of your breast. This procedure can help determine whether a lump in your breast is a fluid-filled sac (cyst), a solid tumor, or a growth. It can also help determine whether a tumor is cancerous (malignant) or noncancerous (benign). Sometimes a breast ultrasound is done to locate nodules in the breast that are too small to feel but need to be removed during surgery. A breast ultrasound takes about 30 minutes. Tell a health care provider about:  Any allergies you have.  All medicines you are taking, including vitamins, herbs, eye drops, creams, and over-the-counter medicines.  Any blood disorders you have.  Any surgeries you have had.  Any medical conditions you have. What are the risks? Breast ultrasound is safe and painless. Ultrasound imaging does not use X-rays. There are no known risks for this procedure. What happens before the procedure? You do not need to prepare for a breast ultrasound. You will undress from your waist up, so wear loose, comfortable clothing on the day of the procedure. What happens during the procedure?  You will lie down on an examining table.  A health care provider will apply gel to your breast area.  You may be asked to hold your arm above your head.  The health care provider will move a handheld probe, which looks like a microphone, back and forth over your breast.  The probe will send signals to a computer that will create images of your breast.  When the procedure is over, the gel will be cleaned from your breast, and you can get dressed. What happens after the procedure?  You can go home right away and do all of your usual activities.  It is up to you to get the results of your procedure. Ask your health care provider, or the department  that is doing the procedure, when your results will be ready. Summary  A breast ultrasound is a procedure that is used to check for breast problems.  This procedure uses harmless sound waves and a computer to create pictures of the inside of your breast.  This is a safe procedure. This information is not intended to replace advice given to you by your health care provider. Make sure you discuss any questions you have with your health care provider. Document Released: 12/27/2004 Document Revised: 08/11/2016 Document Reviewed: 08/08/2016 Elsevier Interactive Patient Education  2017 Elsevier Inc.  

## 2018-11-27 NOTE — Progress Notes (Signed)
+ Patient ID: Kirsten GordonHan Ramirez, female    DOB: 09-06-1971  Age: 47 y.o. MRN: [id number]    Subjective:  Subjective  HPI Kirsten Ramirez presents for extra tissue in axilla b/l-- no pain .   Pt has this in the past and had surgery to remove it.    Review of Systems  Constitutional: Negative for chills and fever.  HENT: Negative for congestion and hearing loss.   Eyes: Negative for discharge.  Respiratory: Negative for cough and shortness of breath.   Cardiovascular: Negative for chest pain, palpitations and leg swelling.  Gastrointestinal: Negative for abdominal pain, blood in stool, constipation, diarrhea, nausea and vomiting.  Genitourinary: Negative for dysuria, frequency, hematuria and urgency.  Musculoskeletal: Negative for back pain and myalgias.  Skin: Negative for rash.  Allergic/Immunologic: Negative for environmental allergies.  Neurological: Negative for dizziness, weakness and headaches.  Hematological: Does not bruise/bleed easily.  Psychiatric/Behavioral: Negative for suicidal ideas. The patient is not nervous/anxious.     History Past Medical History:  Diagnosis Date  . Allergy   . Thyroid disease     She has a past surgical history that includes Appendectomy (1992); Cesarean section (2009, 2011); and Tubal ligation.   Her family history includes Cancer in her father; Hypertension in her father and mother.She reports that she has never smoked. She has never used smokeless tobacco. She reports that she drinks alcohol. She reports that she does not use drugs.  Current Outpatient Medications on File Prior to Visit  Medication Sig Dispense Refill  . levothyroxine (SYNTHROID, LEVOTHROID) 50 MCG tablet TAKE 1 TABLET BY MOUTH DAILY 90 tablet 3   No current facility-administered medications on file prior to visit.      Objective:  Objective  Physical Exam  Constitutional: She is oriented to person, place, and time. She appears well-developed and well-nourished.  HENT:  Head:  Normocephalic and atraumatic.  Eyes: Conjunctivae and EOM are normal.  Neck: Normal range of motion. Neck supple. No JVD present. Carotid bruit is not present. No thyromegaly present.  Cardiovascular: Normal rate, regular rhythm and normal heart sounds.  No murmur heard. Pulmonary/Chest: Effort normal and breath sounds normal. No respiratory distress. She has no wheezes. She has no rales. She exhibits no tenderness. Right breast exhibits mass. Right breast exhibits no inverted nipple, no nipple discharge, no skin change and no tenderness. Left breast exhibits mass. Left breast exhibits no inverted nipple, no nipple discharge, no skin change and no tenderness. There is breast swelling.    Musculoskeletal: She exhibits no edema.  Neurological: She is alert and oriented to person, place, and time.  Psychiatric: She has a normal mood and affect.  Nursing note and vitals reviewed.  BP 100/60 (BP Location: Right Arm, Cuff Size: Normal)   Pulse 73   Temp 98 F (36.7 C) (Oral)   Resp 16   Ht 5\' 1"  (1.549 m)   Wt 116 lb 12.8 oz (53 kg)   SpO2 98%   BMI 22.07 kg/m  Wt Readings from Last 3 Encounters:  11/27/18 116 lb 12.8 oz (53 kg)  05/31/18 114 lb 3.2 oz (51.8 kg)  01/25/18 117 lb 9.6 oz (53.3 kg)     Lab Results  Component Value Date   WBC 2.6 (L) 05/31/2018   HGB 12.8 05/31/2018   HCT 37.7 05/31/2018   PLT 162.0 05/31/2018   GLUCOSE 84 05/31/2018   CHOL 133 05/31/2018   TRIG 48.0 05/31/2018   HDL 58.70 05/31/2018   LDLCALC 65 05/31/2018  ALT 17 05/31/2018   AST 16 05/31/2018   NA 139 05/31/2018   K 4.1 05/31/2018   CL 104 05/31/2018   CREATININE 0.64 05/31/2018   BUN 12 05/31/2018   CO2 27 05/31/2018   TSH 0.78 05/31/2018    Mm 3d Screen Breast Bilateral  Result Date: 06/11/2018 CLINICAL DATA:  Screening. EXAM: DIGITAL SCREENING BILATERAL MAMMOGRAM WITH TOMO AND CAD COMPARISON:  Previous exam(s). ACR Breast Density Category c: The breast tissue is heterogeneously  dense, which may obscure small masses. FINDINGS: There are no findings suspicious for malignancy. Images were processed with CAD. IMPRESSION: No mammographic evidence of malignancy. A result letter of this screening mammogram will be mailed directly to the patient. RECOMMENDATION: Screening mammogram in one year. (Code:SM-B-01Y) BI-RADS CATEGORY  1: Negative. Electronically Signed   By: Amie Portland M.D.   On: 06/11/2018 14:51     Assessment & Plan:  Plan  I am having [name] maintain her levothyroxine.  No orders of the defined types were placed in this encounter.   Problem List Items Addressed This Visit    None    Visit Diagnoses    Axillary mass, bilateral    -  Primary   Relevant Orders   MM Digital Diagnostic Bilat   US BREAST COMPLETE UNI RIGHT INC AXILLA   US BREAST COMPLETE UNI LEFT INC AXILLA    suspect extra fatty tissue Check mammogram  Consider plastic surgery consult    Follow-up: No follow-ups on file.  Donato Schultz, DO

## 2018-12-04 ENCOUNTER — Telehealth: Payer: Self-pay | Admitting: *Deleted

## 2018-12-04 NOTE — Telephone Encounter (Signed)
Copied from CRM [id number]. Topic: General - Inquiry >> Dec 03, 2018  1:03 PM Windy KalataMichael, Taylor L, NT wrote: Reason for CRM: patient was seen on 11/27/18 and states she was told to call back if she had not heard from anyone in regards to scheduling images.

## 2018-12-07 NOTE — Telephone Encounter (Signed)
appts scheduled for 12/10/18

## 2018-12-10 ENCOUNTER — Ambulatory Visit
Admission: RE | Admit: 2018-12-10 | Discharge: 2018-12-10 | Disposition: A | Discharge status: Home / Self Care | Payer: Managed Care, Other (non HMO) | Source: Ambulatory Visit | Attending: Family Medicine | Admitting: Family Medicine

## 2018-12-10 DIAGNOSIS — R2233 Localized swelling, mass and lump, upper limb, bilateral: Secondary | ICD-10-CM

## 2019-01-18 ENCOUNTER — Telehealth: Payer: Self-pay | Admitting: Family Medicine

## 2019-01-18 NOTE — Telephone Encounter (Signed)
Pt came by office to update her insurance information for 2020.  Also her pharmacy information is now [facility] and pt needs a refill sent to [facility] for her Levothyroxine Sodium (Tab) SYNTHROID, LEVOTHROID 50 MCG TAKE 1 TABLET BY MOUTH DAILY  Pt stated she needs a 90 day supply sent in to [facility].

## 2019-01-21 MED ORDER — LEVOTHYROXINE SODIUM 50 MCG PO TABS: 50.0000 ug | ORAL_TABLET | Freq: Every day | ORAL | 3 refills | Status: DC

## 2019-01-21 NOTE — Telephone Encounter (Signed)
rx sent in 

## 2019-01-21 NOTE — Addendum Note (Signed)
Addended by: Thelma Barge D on: 01/21/2019 12:34 PM   Modules accepted: Orders

## 2019-01-21 NOTE — Telephone Encounter (Signed)
Left message on machine that medication was sent to [facility].

## 2019-04-13 ENCOUNTER — Emergency Department (HOSPITAL_BASED_OUTPATIENT_CLINIC_OR_DEPARTMENT_OTHER): Payer: Commercial Managed Care - PPO

## 2019-04-13 ENCOUNTER — Emergency Department (HOSPITAL_BASED_OUTPATIENT_CLINIC_OR_DEPARTMENT_OTHER)
Admission: EM | Admit: 2019-04-13 | Discharge: 2019-04-13 | Disposition: A | Discharge status: Home / Self Care | Payer: Commercial Managed Care - PPO | Attending: Emergency Medicine | Admitting: Emergency Medicine

## 2019-04-13 ENCOUNTER — Other Ambulatory Visit: Payer: Self-pay

## 2019-04-13 ENCOUNTER — Encounter (HOSPITAL_BASED_OUTPATIENT_CLINIC_OR_DEPARTMENT_OTHER): Payer: Self-pay | Admitting: *Deleted

## 2019-04-13 DIAGNOSIS — E039 Hypothyroidism, unspecified: Secondary | ICD-10-CM | POA: Insufficient documentation

## 2019-04-13 DIAGNOSIS — S61211A Laceration without foreign body of left index finger without damage to nail, initial encounter: Secondary | ICD-10-CM | POA: Diagnosis not present

## 2019-04-13 DIAGNOSIS — Y999 Unspecified external cause status: Secondary | ICD-10-CM | POA: Insufficient documentation

## 2019-04-13 DIAGNOSIS — Y93H2 Activity, gardening and landscaping: Secondary | ICD-10-CM | POA: Diagnosis not present

## 2019-04-13 DIAGNOSIS — Y9289 Other specified places as the place of occurrence of the external cause: Secondary | ICD-10-CM | POA: Insufficient documentation

## 2019-04-13 DIAGNOSIS — W293XXA Contact with powered garden and outdoor hand tools and machinery, initial encounter: Secondary | ICD-10-CM | POA: Insufficient documentation

## 2019-04-13 DIAGNOSIS — Z79899 Other long term (current) drug therapy: Secondary | ICD-10-CM | POA: Diagnosis not present

## 2019-04-13 DIAGNOSIS — S6992XA Unspecified injury of left wrist, hand and finger(s), initial encounter: Secondary | ICD-10-CM | POA: Diagnosis present

## 2019-04-13 MED ORDER — CEPHALEXIN 500 MG PO CAPS: 500.0000 mg | ORAL_CAPSULE | Freq: Three times a day (TID) | ORAL | 0 refills | Status: DC

## 2019-04-13 MED ORDER — LIDOCAINE HCL (PF) 1 % IJ SOLN
5.0000 mL | Freq: Once | INTRAMUSCULAR | Status: DC
  Filled 2019-04-13: qty 5

## 2019-04-13 MED FILL — CEPHALEXIN 500 MG CAPSULE: 500 | 5 days supply | Qty: 15 | Fill #0

## 2019-04-13 NOTE — Discharge Instructions (Signed)
Please read and follow all provided instructions.  Your diagnoses today include:  1. Laceration of left index finger without foreign body without damage to nail, initial encounter     Tests performed today include:  X-ray of the affected area that did not show any foreign bodies or broken bones  Vital signs. See below for your results today.   Medications prescribed:   Keflex (cephalexin) - antibiotic  You have been prescribed an antibiotic medicine: take the entire course of medicine even if you are feeling better. Stopping early can cause the antibiotic not to work.  Take any prescribed medications only as directed.   Home care instructions:  Follow any educational materials and wound care instructions contained in this packet.   Keep affected area above the level of your heart when possible to minimize swelling. Wash area gently twice a day with warm soapy water. Do not apply alcohol or hydrogen peroxide. Cover the area if it draining or weeping.   Follow-up instructions: Suture Removal: Return to the Emergency Department or see your primary care care doctor in 10 days for a recheck of your wound and removal of your sutures or staples.    Return instructions:  Return to the Emergency Department if you have:  Fever  Worsening pain  Worsening swelling of the wound  Pus draining from the wound  Redness of the skin that moves away from the wound, especially if it streaks away from the affected area   Any other emergent concerns  Your vital signs today were: BP (!) 135/98 (BP Location: Right Arm)    Pulse 67    Temp 98.4 F (36.9 C) (Oral)    Resp 14    Ht 5\' 2"  (1.575 m)    Wt 53.5 kg    LMP 04/06/2019    SpO2 100%    BMI 21.58 kg/m  If your blood pressure (BP) was elevated above 135/85 this visit, please have this repeated by your doctor within one month. --------------

## 2019-04-13 NOTE — ED Notes (Signed)
ED Provider at bedside. 

## 2019-04-13 NOTE — ED Triage Notes (Addendum)
Injury to left index finger with chainsaw just pta. Seen at Urgent Care and sent here for further eval. Pt states they numbed her finger and wrapped in it prior to her coming here

## 2019-04-13 NOTE — ED Provider Notes (Signed)
[facility] EMERGENCY DEPARTMENT Provider Note   CSN: [id number] Arrival date & time: 04/13/19  1544    History   Chief Complaint Chief Complaint  Patient presents with  . Laceration    HPI Kirsten Ramirez is a 48 y.o. female.     Patient presents with complaint of laceration to her left index finger sustained approximately 1 hour prior to arrival.  Patient states that she was cutting hedges with a chain saw when she clipped her finger.  She went to an urgent care who anesthetized the wound and sent her here stating it was too deep and they could not close it.  They wrapped it prior to sending her here.  Patient states that her last tetanus has been within 2 years.  She denies other injuries.  Her finger is currently numb but she does not think there was any numbness after the injury.  The wound continues to bleed.  She denies use of any anticoagulation.  No other injuries.  Onset of symptoms acute.  Course is constant.     Past Medical History:  Diagnosis Date  . Allergy   . Thyroid disease     Patient Active Problem List   Diagnosis Date Noted  . Preventative health care 06/02/2018  . Pelvic pain in female 09/25/2015  . Wellness examination 01/02/2015  . Hypothyroidism 12/29/2014  . Urticaria 12/29/2014    Past Surgical History:  Procedure Laterality Date  . APPENDECTOMY  1992  . CESAREAN SECTION  2009, 2011  . TUBAL LIGATION       OB History    Gravida  2   Para  2   Term      Preterm  1   AB      Living  2     SAB      TAB      Ectopic      Multiple      Live Births               Home Medications    Prior to Admission medications   Medication Sig Start Date End Date Taking? Authorizing Provider  levothyroxine (SYNTHROID, LEVOTHROID) 50 MCG tablet Take 1 tablet (50 mcg total) by mouth daily. 01/21/19   Donato Schultz, DO    Family History Family History  Problem Relation Age of Onset  . Hypertension Mother   .  Hypertension Father   . Cancer Father        colon, liver    Social History Social History   Tobacco Use  . Smoking status: Never Smoker  . Smokeless tobacco: Never Used  Substance Use Topics  . Alcohol use: Yes    Alcohol/week: 0.0 standard drinks    Comment: only very rare and very little around holidays.  . Drug use: No     Allergies   Patient has no known allergies.   Review of Systems Review of Systems  Constitutional: Negative for activity change.  Musculoskeletal: Positive for myalgias.  Skin: Positive for wound.     Physical Exam Updated Vital Signs BP (!) 135/98 (BP Location: Right Arm)   Pulse 67   Temp 98.4 F (36.9 C) (Oral)   Resp 14   LMP 04/06/2019   SpO2 100%   Physical Exam Vitals signs and nursing note reviewed.  Constitutional:      Appearance: She is well-developed.  HENT:     Head: Normocephalic and atraumatic.  Eyes:     Conjunctiva/sclera:  Conjunctivae normal.  Neck:     Musculoskeletal: Normal range of motion and neck supple.  Pulmonary:     Effort: No respiratory distress.  Skin:    General: Skin is warm and dry.     Comments: L index finger: There is a 1 cm oblique laceration that crosses the volar surface from the lateral aspect ulnarly to the mid volar area.  The wound is between be PIP and DIP creases.  Wound base is clean.  No extension into the tendon sheath noted.  It appears on my visual exam with tourniquet in place to only involve the overlying tissues.  Neurological:     Mental Status: She is alert.      ED Treatments / Results  Labs (all labs ordered are listed, but only abnormal results are displayed) Labs Reviewed - No data to display  EKG None  Radiology No results found.  Procedures .Marland Kitchen.Laceration Repair Date/Time: 04/13/2019 4:53 PM Performed by: Renne CriglerGeiple, Helene Bernstein, PA-C Authorized by: Renne CriglerGeiple, Madisin Hasan, PA-C   Consent:    Consent obtained:  Verbal   Consent given by:  Patient   Risks discussed:   Infection, pain, nerve damage, poor wound healing and tendon damage Anesthesia (see MAR for exact dosages):    Anesthesia method: previous anesthesia. Laceration details:    Location:  Finger   Finger location:  L index finger   Length (cm):  1 Repair type:    Repair type:  Simple Pre-procedure details:    Preparation:  Patient was prepped and draped in usual sterile fashion and imaging obtained to evaluate for foreign bodies Exploration:    Hemostasis achieved with:  Direct pressure and tourniquet   Wound exploration: wound explored through full range of motion and entire depth of wound probed and visualized     Wound extent: no tendon damage noted   Treatment:    Wound cleansed with: soaked with betadine PTA.   Amount of cleaning:  Extensive   Irrigation solution:  Sterile saline   Irrigation volume:  1000cc   Irrigation method:  Pressure wash (cap on 1000cc bottle)   Visualized foreign bodies/material removed: no   Skin repair:    Repair method:  Sutures   Suture size:  5-0   Suture material:  Nylon   Suture technique:  Simple interrupted   Number of sutures:  5 Post-procedure details:    Dressing:  Bulky dressing   Patient tolerance of procedure:  Tolerated well, no immediate complications   (including critical care time)  Medications Ordered in ED Medications  lidocaine (PF) (XYLOCAINE) 1 % injection 5 mL (5 mLs Infiltration Given 04/13/19 1615)     Initial Impression / Assessment and Plan / ED Course  I have reviewed the triage vital signs and the nursing notes.  Pertinent labs & imaging results that were available during my care of the patient were reviewed by me and considered in my medical decision making (see chart for details).        Patient seen and examined. Work-up initiated.  I removed the dressing.  Patient's finger is currently anesthetized so complete neuro exam cannot be performed.  Capillary refill is less than 2 seconds.  Patient has good strength  in flexion of her left index finger without any subtle weakness detected.  Will perform x-ray to evaluate for underlying fracture. Patient initially questioned need for x-ray but agrees after I explained utility.   Plan for tourniquet placement, cleaning, wound exam, and likely closure.  Will determine need  for orthopedic hand involvement.  Vital signs reviewed and are as follows: BP (!) 135/98 (BP Location: Right Arm)   Pulse 67   Temp 98.4 F (36.9 C) (Oral)   Resp 14   LMP 04/06/2019   SpO2 100%   4:55 PM tourniquet placed, wound cleaned extensively and evaluated.  As discussed above, no complicating factors such as tendon involvement noted.  Discussed with patient that she could potentially have nerve damage to her finger and if she has any discomfort, numbness, or tingling during wound healing she should follow-up with her primary care doctor.  Pending x-ray.  5:26 PM X-ray images personally reviewed and interpreted.  No foreign body or bony injury seen.  We will discharged home.  Patient counseled on wound care. Patient counseled on need to return or see PCP/urgent care for suture removal in 10 days. Patient was urged to return to the Emergency Department urgently with worsening pain, swelling, expanding erythema especially if it streaks away from the affected area, fever, or if they have any other concerns. Patient verbalized understanding.     Final Clinical Impressions(s) / ED Diagnoses   Final diagnoses:  Laceration of left index finger without foreign body without damage to nail, initial encounter   Left index finger laceration without complicating factors.  X-ray negative.  Tetanus up-to-date.  5 days of antibiotics given location of wound.  Return instructions as above.   ED Discharge Orders         Ordered    cephALEXin (KEFLEX) 500 MG capsule  3 times daily     04/13/19 1706           Renne Crigler, PA-C 04/13/19 1727    Pricilla Loveless, MD 04/13/19 [phone]

## 2019-09-06 ENCOUNTER — Ambulatory Visit: Payer: Self-pay

## 2019-09-06 NOTE — Telephone Encounter (Signed)
Patient called stating that she has a boil between her legs leg next to outer labia.  She has had them boil for at least 3 week and was given antibiotic  by health department at work.  She finished the medication and the boil has returned.  She states that she has squeezed at it and drained pus and blood. Today it is hard and the size of a small finger or green bean "not round long". She has no fever and the pain is severe when touched. She has applied heat. She has no other boils . Per protocol patient was told to seek care at [facility] or ER for her problem. Care advice read to patient. Patient verbalized understanding.  Reason for Disposition . Boil > 2 inches across (> 5 cm; larger than a golf ball or ping pong ball)  Answer Assessment - Initial Assessment Questions 1. APPEARANCE of BOIL: "What does the boil look like?"      Hard red 2. LOCATION: "Where is the boil located?"      In between leg and vaginal area rt side 3. NUMBER: "How many boils are there?"      one 4. SIZE: "How big is the boil?" (e.g., inches, cm; compare to size of a coin or other object)    Long finger witth   5. ONSET: "When did the boil start?"     2=3 weeks antibiotic from work nurse Aug 21 started 6. PAIN: "Is there any pain?" If so, ask: "How bad is the pain?"   (Scale 1-10; or mild, moderate, severe)    10 with pressure 7. FEVER: "Do you have a fever?" If so, ask: "What is it, how was it measured, and when did it start?"     none 8. SOURCE: "Have you been around anyone with boils or other Staph infections?" "Have you ever had boils before?"     no 9. OTHER SYMPTOMS: "Do you have any other symptoms?" (e.g., shaking chills, weakness, rash elsewhere on body)     none 10. PREGNANCY: "Is there any chance you are pregnant?" "When was your last menstrual period?"      No premenopausal  Aug Sept spotting  Protocols used: BOIL (SKIN ABSCESS)-A-AH

## 2019-09-09 NOTE — Telephone Encounter (Signed)
[facility]

## 2019-09-09 NOTE — Telephone Encounter (Signed)
Do you want to see patient or do you want to refer to GYN? Please advise

## 2019-09-10 ENCOUNTER — Encounter: Payer: Self-pay | Admitting: Family Medicine

## 2019-09-10 ENCOUNTER — Ambulatory Visit: Payer: Commercial Managed Care - PPO | Admitting: Family Medicine

## 2019-09-10 ENCOUNTER — Other Ambulatory Visit: Payer: Self-pay

## 2019-09-10 ENCOUNTER — Telehealth: Payer: Self-pay | Admitting: *Deleted

## 2019-09-10 VITALS — BP 98/66 | HR 66 | Temp 97.7°F | Resp 18 | Ht 62.0 in | Wt 121.2 lb

## 2019-09-10 DIAGNOSIS — Z23 Encounter for immunization: Secondary | ICD-10-CM

## 2019-09-10 DIAGNOSIS — L723 Sebaceous cyst: Secondary | ICD-10-CM | POA: Diagnosis not present

## 2019-09-10 DIAGNOSIS — L089 Local infection of the skin and subcutaneous tissue, unspecified: Secondary | ICD-10-CM | POA: Diagnosis not present

## 2019-09-10 DIAGNOSIS — Z01419 Encounter for gynecological examination (general) (routine) without abnormal findings: Secondary | ICD-10-CM

## 2019-09-10 MED ORDER — DOXYCYCLINE HYCLATE 100 MG PO TABS: 100.0000 mg | ORAL_TABLET | Freq: Two times a day (BID) | ORAL | 0 refills | Status: DC

## 2019-09-10 MED FILL — DOXYCYCLINE HYCLATE 100 MG: 100 | 10 days supply | Qty: 20 | Fill #0

## 2019-09-10 NOTE — Progress Notes (Signed)
Patient ID: Kirsten Ramirez, female    DOB: 05-22-1971  Age: 48 y.o. MRN: [id number]    Subjective:  Subjective  HPI Kirsten Ramirez presents for an abscess on her pubic area.  It has gone down some-- it was much worse.  She was on clindamycin for 10 days and that helped but it then came right back although not as big----- it is not currently draining   No fevers  Pt states she also gets reccurrent cysts on her labia---- she does not have any now but would like to establish with a gyn  Review of Systems  Constitutional: Negative for appetite change, diaphoresis, fatigue and unexpected weight change.  Eyes: Negative for pain, redness and visual disturbance.  Respiratory: Negative for cough, chest tightness, shortness of breath and wheezing.   Cardiovascular: Negative for chest pain, palpitations and leg swelling.  Endocrine: Negative for cold intolerance, heat intolerance, polydipsia, polyphagia and polyuria.  Genitourinary: Negative for difficulty urinating, dysuria and frequency.  Neurological: Negative for dizziness, light-headedness, numbness and headaches.    History Past Medical History:  Diagnosis Date  . Allergy   . Thyroid disease     She has a past surgical history that includes Appendectomy (1992); Cesarean section (2009, 2011); and Tubal ligation.   Her family history includes Cancer in her father; Hypertension in her father and mother.She reports that she has never smoked. She has never used smokeless tobacco. She reports current alcohol use. She reports that she does not use drugs.  Current Outpatient Medications on File Prior to Visit  Medication Sig Dispense Refill  . levothyroxine (SYNTHROID, LEVOTHROID) 50 MCG tablet Take 1 tablet (50 mcg total) by mouth daily. 90 tablet 3   No current facility-administered medications on file prior to visit.      Objective:  Objective  Physical Exam Vitals signs and nursing note reviewed.  Constitutional:      Appearance: She is  well-developed.  HENT:     Head: Normocephalic and atraumatic.  Eyes:     Conjunctiva/sclera: Conjunctivae normal.  Neck:     Musculoskeletal: Normal range of motion and neck supple.     Thyroid: No thyromegaly.     Vascular: No carotid bruit or JVD.  Cardiovascular:     Rate and Rhythm: Normal rate and regular rhythm.     Heart sounds: Normal heart sounds. No murmur.  Pulmonary:     Effort: Pulmonary effort is normal. No respiratory distress.     Breath sounds: Normal breath sounds. No wheezing or rales.  Chest:     Chest wall: No tenderness.  Skin:      Neurological:     Mental Status: She is alert and oriented to person, place, and time.    BP 98/66 (BP Location: Left Arm, Patient Position: Sitting, Cuff Size: Normal)   Pulse 66   Temp 97.7 F (36.5 C) (Temporal)   Resp 18   Ht 5\' 2"  (1.575 m)   Wt 121 lb 3.2 oz (55 kg)   SpO2 98%   BMI 22.17 kg/m  Wt Readings from Last 3 Encounters:  09/10/19 121 lb 3.2 oz (55 kg)  04/13/19 118 lb (53.5 kg)  11/27/18 116 lb 12.8 oz (53 kg)     Lab Results  Component Value Date   WBC 2.6 (L) 05/31/2018   HGB 12.8 05/31/2018   HCT 37.7 05/31/2018   PLT 162.0 05/31/2018   GLUCOSE 84 05/31/2018   CHOL 133 05/31/2018   TRIG 48.0 05/31/2018  HDL 58.70 05/31/2018   LDLCALC 65 05/31/2018   ALT 17 05/31/2018   AST 16 05/31/2018   NA 139 05/31/2018   K 4.1 05/31/2018   CL 104 05/31/2018   CREATININE 0.64 05/31/2018   BUN 12 05/31/2018   CO2 27 05/31/2018   TSH 0.78 05/31/2018    Dg Finger Index Left  Result Date: 04/13/2019 CLINICAL DATA:  Laceration to the LEFT index finger related to a chainsaw accident earlier today. Initial encounter. EXAM: LEFT INDEX FINGER 2+V COMPARISON:  None. FINDINGS: No evidence of acute fracture or dislocation. Benign bone island in the distal phalanx. No significant intrinsic osseous abnormality. Well-preserved joint spaces. Well-preserved bone mineral density. No opaque foreign bodies in the  soft tissues. IMPRESSION: 1. No acute or significant osseous abnormality. 2. No radiopaque foreign body. Electronically Signed   By: Evangeline Dakin M.D.   On: 04/13/2019 17:49     Assessment & Plan:  Plan  I have discontinued Leeann Swavely's cephALEXin. I am also having her start on doxycycline. Additionally, I am having her maintain her levothyroxine.  Meds ordered this encounter  Medications  . doxycycline (VIBRA-TABS) 100 MG tablet    Sig: Take 1 tablet (100 mg total) by mouth 2 (two) times daily.    Dispense:  20 tablet    Refill:  0    Problem List Items Addressed This Visit      Unprioritized   infected abscess - Primary    Warm compresses  abx x 10 days        Relevant Medications   doxycycline (VIBRA-TABS) 100 MG tablet   Other Relevant Orders   Ambulatory referral to Obstetrics / Gynecology    Other Visit Diagnoses    Encounter for gynecological examination       Relevant Orders   Ambulatory referral to Obstetrics / Gynecology   Need for influenza vaccination       Relevant Orders   Flu Vaccine QUAD 36+ mos IM (Completed)      Follow-up: Return if symptoms worsen or fail to improve.  Ann Held, DO

## 2019-09-10 NOTE — Assessment & Plan Note (Signed)
Warm compresses  abx x 10 days

## 2019-09-10 NOTE — Patient Instructions (Signed)

## 2019-09-10 NOTE — Telephone Encounter (Signed)
Copied from [place] [id number]. Topic: General - Other >> Sep 10, 2019  1:19 PM Rayann Heman wrote: Reason for CRM: pt called and stated that she would like to come in this afternoon if possible for boil. Please advise

## 2019-09-10 NOTE — Telephone Encounter (Signed)
Left message on machine that we are unable to get her in this afternoon and to keep appointment for Thursday.

## 2019-09-10 NOTE — Telephone Encounter (Signed)
Patient stated that she has not seen anyone and boil has gotten smaller since she has been using hot compresses.  Advised that we do not lance in that area and will refer if need be.  Appt scheduled for Thursday afternoon.  Advised if it gets worse to go to nearest urgent care.

## 2019-09-12 ENCOUNTER — Ambulatory Visit: Payer: Commercial Managed Care - PPO | Admitting: Family Medicine

## 2019-10-16 ENCOUNTER — Other Ambulatory Visit: Payer: Self-pay

## 2019-10-16 ENCOUNTER — Encounter: Payer: Self-pay | Admitting: Obstetrics & Gynecology

## 2019-10-16 ENCOUNTER — Ambulatory Visit: Payer: Commercial Managed Care - PPO | Admitting: Obstetrics & Gynecology

## 2019-10-16 VITALS — BP 109/65 | HR 72 | Wt 121.0 lb

## 2019-10-16 DIAGNOSIS — B373 Candidiasis of vulva and vagina: Secondary | ICD-10-CM | POA: Diagnosis not present

## 2019-10-16 DIAGNOSIS — N898 Other specified noninflammatory disorders of vagina: Secondary | ICD-10-CM

## 2019-10-16 DIAGNOSIS — Z124 Encounter for screening for malignant neoplasm of cervix: Secondary | ICD-10-CM | POA: Diagnosis not present

## 2019-10-16 DIAGNOSIS — Z01419 Encounter for gynecological examination (general) (routine) without abnormal findings: Secondary | ICD-10-CM

## 2019-10-16 DIAGNOSIS — B3731 Acute candidiasis of vulva and vagina: Secondary | ICD-10-CM

## 2019-10-16 DIAGNOSIS — Z113 Encounter for screening for infections with a predominantly sexual mode of transmission: Secondary | ICD-10-CM | POA: Diagnosis not present

## 2019-10-16 DIAGNOSIS — Z1211 Encounter for screening for malignant neoplasm of colon: Secondary | ICD-10-CM

## 2019-10-16 DIAGNOSIS — L723 Sebaceous cyst: Secondary | ICD-10-CM

## 2019-10-16 DIAGNOSIS — Z1231 Encounter for screening mammogram for malignant neoplasm of breast: Secondary | ICD-10-CM

## 2019-10-16 NOTE — Progress Notes (Signed)
Subjective:     Kirsten Ramirez is a 48 y.o. female here for a routine exam. G2P2 LMP 3 months prev.  Current complaints: pt has a sebaceous cyst on the vulva that has been treated x 2 with an antibiotic. Pt has been squeezing it and it sometimes drains fluid. She reports that if rubs on her underwear and can be uncomfortable. She sometimes feel pain going down her right leg due to the pain.     Gynecologic History No LMP recorded. Contraception: perimenopause  Last Pap: 04/05/2016. Results were: normal Last mammogram: 12/10/2018. Results were: normal  Obstetric History OB History  Gravida Para Term Preterm AB Living  2 2   1   2   SAB TAB Ectopic Multiple Live Births               # Outcome Date GA Lbr Len/2nd Weight Sex Delivery Anes PTL Lv  2 Para    7 lb (3.175 kg)  CS-Classical     1 Preterm    6 lb (2.722 kg)  CS-Classical      The following portions of the patient's history were reviewed and updated as appropriate: allergies, current medications, past family history, past medical history, past social history, past surgical history and problem list.  Review of Systems Pertinent items are noted in HPI.    Objective:  BP 109/65   Pulse 72   Wt 121 lb (54.9 kg)   BMI 22.13 kg/m   General Appearance:    Alert, cooperative, no distress, appears stated age  Head:    Normocephalic, without obvious abnormality, atraumatic  Eyes:    conjunctiva/corneas clear, EOM's intact, both eyes  Ears:    Normal external ear canals, both ears  Nose:   Nares normal, septum midline, mucosa normal, no drainage    or sinus tenderness  Throat:   Lips, mucosa, and tongue normal; teeth and gums normal  Neck:   Supple, symmetrical, trachea midline, no adenopathy;    thyroid:  no enlargement/tenderness/nodules  Back:     Symmetric, no curvature, ROM normal, no CVA tenderness  Lungs:     respirations unlabored  Chest Wall:    No tenderness or deformity   Heart:    Regular rate and rhythm  Breast Exam:    No  tenderness, masses, or nipple abnormality; no axillary LA or masses  Abdomen:     Soft, non-tender, bowel sounds active all four quadrants,    no masses, no organomegaly  Genitalia:    Normal female without lesion, discharge or tenderness   There is a resolving non infected sebaceous cyst on the mons pubis on the right side. No inguinal LA noted.   Extremities:   Extremities normal, atraumatic, no cyanosis or edema  Pulses:   2+ and symmetric all extremities  Skin:   Skin color, texture, turgor normal, no rashes or lesions    Assessment:    Healthy female exam.   Breast cancer screen Sebaceous cyst with inflammation     Plan:    f/u PAP with hrHPV Screening mammogram  Colon cancer screen -needs referral to GI sebaceous cyst- pt instructed to stop squeezing g this area. Use a warm compress prn. If it comes to head, pt is just to allow this to drain.   F/u 1 year annual  F/u 2 weeks to recheck cyst if needed.      Dakari Stabler L. Harraway-Smith, M.D., Cherlynn June

## 2019-10-16 NOTE — Progress Notes (Signed)
Patient reports having a cyst in her groin area for a couple months. She has been treated with antibiotics with no relief.   LAST PAP 04/05/2016 Need mammogram

## 2019-10-16 NOTE — Addendum Note (Signed)
Addended by: Phillip Heal, DEMETRICE A on: 10/16/2019 03:14 PM   Modules accepted: Orders

## 2019-10-16 NOTE — Patient Instructions (Signed)
Perimenopause  Perimenopause is the normal time of life before and after menstrual periods stop completely (menopause). Perimenopause can begin 2-8 years before menopause, and it usually lasts for 1 year after menopause. During perimenopause, the ovaries may or may not produce an egg. What are the causes? This condition is caused by a natural change in hormone levels that happens as you get older. What increases the risk? This condition is more likely to start at an earlier age if you have certain medical conditions or treatments, including:  A tumor of the pituitary gland in the brain.  A disease that affects the ovaries and hormone production.  Radiation treatment for cancer.  Certain cancer treatments, such as chemotherapy or hormone (anti-estrogen) therapy.  Heavy smoking and excessive alcohol use.  Family history of early menopause. What are the signs or symptoms? Perimenopausal changes affect each woman differently. Symptoms of this condition may include:  Hot flashes.  Night sweats.  Irregular menstrual periods.  Decreased sex drive.  Vaginal dryness.  Headaches.  Mood swings.  Depression.  Memory problems or trouble concentrating.  Irritability.  Tiredness.  Weight gain.  Anxiety.  Trouble getting pregnant. How is this diagnosed? This condition is diagnosed based on your medical history, a physical exam, your age, your menstrual history, and your symptoms. Hormone tests may also be done. How is this treated? In some cases, no treatment is needed. You and your health care provider should make a decision together about whether treatment is necessary. Treatment will be based on your individual condition and preferences. Various treatments are available, such as:  Menopausal hormone therapy (MHT).  Medicines to treat specific symptoms.  Acupuncture.  Vitamin or herbal supplements. Before starting treatment, make sure to let your health care provider  know if you have a personal or family history of:  Heart disease.  Breast cancer.  Blood clots.  Diabetes.  Osteoporosis. Follow these instructions at home: Lifestyle  Do not use any products that contain nicotine or tobacco, such as cigarettes and e-cigarettes. If you need help quitting, ask your health care provider.  Eat a balanced diet that includes fresh fruits and vegetables, whole grains, soybeans, eggs, lean meat, and low-fat dairy.  Get at least 30 minutes of physical activity on 5 or more days each week.  Avoid alcoholic and caffeinated beverages, as well as spicy foods. This may help prevent hot flashes.  Get 7-8 hours of sleep each night.  Dress in layers that can be removed to help you manage hot flashes.  Find ways to manage stress, such as deep breathing, meditation, or journaling. General instructions  Keep track of your menstrual periods, including: ? When they occur. ? How heavy they are and how long they last. ? How much time passes between periods.  Keep track of your symptoms, noting when they start, how often you have them, and how long they last.  Take over-the-counter and prescription medicines only as told by your health care provider.  Take vitamin supplements only as told by your health care provider. These may include calcium, vitamin E, and vitamin D.  Use vaginal lubricants or moisturizers to help with vaginal dryness and improve comfort during sex.  Talk with your health care provider before starting any herbal supplements.  Keep all follow-up visits as told by your health care provider. This is important. This includes any group therapy or counseling. Contact a health care provider if:  You have heavy vaginal bleeding or pass blood clots.  Your period   lasts more than 2 days longer than normal.  Your periods are recurring sooner than 21 days.  You bleed after having sex. Get help right away if:  You have chest pain, trouble  breathing, or trouble talking.  You have severe depression.  You have pain when you urinate.  You have severe headaches.  You have vision problems. Summary  Perimenopause is the time when a woman's body begins to move into menopause. This may happen naturally or as a result of other health problems or medical treatments.  Perimenopause can begin 2-8 years before menopause, and it usually lasts for 1 year after menopause.  Perimenopausal symptoms can be managed through medicines, lifestyle changes, and complementary therapies such as acupuncture. This information is not intended to replace advice given to you by your health care provider. Make sure you discuss any questions you have with your health care provider. Document Released: 01/12/2005 Document Revised: 11/17/2017 Document Reviewed: 01/10/2017 Elsevier Patient Education  2020 [place]. Menopause Menopause is the normal time of life when menstrual periods stop completely. It is usually confirmed by 12 months without a menstrual period. The transition to menopause (perimenopause) most often happens between the ages of 63 and 90. During perimenopause, hormone levels change in your body, which can cause symptoms and affect your health. Menopause may increase your risk for:  Loss of bone (osteoporosis), which causes bone breaks (fractures).  Depression.  Hardening and narrowing of the arteries (atherosclerosis), which can cause heart attacks and strokes. What are the causes? This condition is usually caused by a natural change in hormone levels that happens as you get older. The condition may also be caused by surgery to remove both ovaries (bilateral oophorectomy). What increases the risk? This condition is more likely to start at an earlier age if you have certain medical conditions or treatments, including:  A tumor of the pituitary gland in the brain.  A disease that affects the ovaries and hormone production.   Radiation treatment for cancer.  Certain cancer treatments, such as chemotherapy or hormone (anti-estrogen) therapy.  Heavy smoking and excessive alcohol use.  Family history of early menopause. This condition is also more likely to develop earlier in women who are very thin. What are the signs or symptoms? Symptoms of this condition include:  Hot flashes.  Irregular menstrual periods.  Night sweats.  Changes in feelings about sex. This could be a decrease in sex drive or an increased comfort around your sexuality.  Vaginal dryness and thinning of the vaginal walls. This may cause painful intercourse.  Dryness of the skin and development of wrinkles.  Headaches.  Problems sleeping (insomnia).  Mood swings or irritability.  Memory problems.  Weight gain.  Hair growth on the face and chest.  Bladder infections or problems with urinating. How is this diagnosed? This condition is diagnosed based on your medical history, a physical exam, your age, your menstrual history, and your symptoms. Hormone tests may also be done. How is this treated? In some cases, no treatment is needed. You and your health care provider should make a decision together about whether treatment is necessary. Treatment will be based on your individual condition and preferences. Treatment for this condition focuses on managing symptoms. Treatment may include:  Menopausal hormone therapy (MHT).  Medicines to treat specific symptoms or complications.  Acupuncture.  Vitamin or herbal supplements. Before starting treatment, make sure to let your health care provider know if you have a personal or family history of:  Heart  disease.  Breast cancer.  Blood clots.  Diabetes.  Osteoporosis. Follow these instructions at home: Lifestyle  Do not use any products that contain nicotine or tobacco, such as cigarettes and e-cigarettes. If you need help quitting, ask your health care provider.  Get at  least 30 minutes of physical activity on 5 or more days each week.  Avoid alcoholic and caffeinated beverages, as well as spicy foods. This may help prevent hot flashes.  Get 7-8 hours of sleep each night.  If you have hot flashes, try: ? Dressing in layers. ? Avoiding things that may trigger hot flashes, such as spicy food, warm places, or stress. ? Taking slow, deep breaths when a hot flash starts. ? Keeping a fan in your home and office.  Find ways to manage stress, such as deep breathing, meditation, or journaling.  Consider going to group therapy with other women who are having menopause symptoms. Ask your health care provider about recommended group therapy meetings. Eating and drinking  Eat a healthy, balanced diet that contains whole grains, lean protein, low-fat dairy, and plenty of fruits and vegetables.  Your health care provider may recommend adding more soy to your diet. Foods that contain soy include tofu, tempeh, and soy milk.  Eat plenty of foods that contain calcium and vitamin D for bone health. Items that are rich in calcium include low-fat milk, yogurt, beans, almonds, sardines, broccoli, and kale. Medicines  Take over-the-counter and prescription medicines only as told by your health care provider.  Talk with your health care provider before starting any herbal supplements. If prescribed, take vitamins and supplements as told by your health care provider. These may include: ? Calcium. Women age 33 and older should get 1,200 mg (milligrams) of calcium every day. ? Vitamin D. Women need 600-800 International Units of vitamin D each day. ? Vitamins B12 and B6. Aim for 50 micrograms of B12 and 1.5 mg of B6 each day. General instructions  Keep track of your menstrual periods, including: ? When they occur. ? How heavy they are and how long they last. ? How much time passes between periods.  Keep track of your symptoms, noting when they start, how often you have  them, and how long they last.  Use vaginal lubricants or moisturizers to help with vaginal dryness and improve comfort during sex.  Keep all follow-up visits as told by your health care provider. This is important. This includes any group therapy or counseling. Contact a health care provider if:  You are still having menstrual periods after age 70.  You have pain during sex.  You have not had a period for 12 months and you develop vaginal bleeding. Get help right away if:  You have: ? Severe depression. ? Excessive vaginal bleeding. ? Pain when you urinate. ? A fast or irregular heart beat (palpitations). ? Severe headaches. ? Abdomen (abdominal) pain or severe indigestion.  You fell and you think you have a broken bone.  You develop leg or chest pain.  You develop vision problems.  You feel a lump in your breast. Summary  Menopause is the normal time of life when menstrual periods stop completely. It is usually confirmed by 12 months without a menstrual period.  The transition to menopause (perimenopause) most often happens between the ages of 33 and 50.  Symptoms can be managed through medicines, lifestyle changes, and complementary therapies such as acupuncture.  Eat a balanced diet that is rich in nutrients to promote bone health  and heart health and to manage symptoms during menopause. This information is not intended to replace advice given to you by your health care provider. Make sure you discuss any questions you have with your health care provider. Document Released: 02/25/2004 Document Revised: 11/17/2017 Document Reviewed: 01/07/2017 Elsevier Patient Education  2020 Elsevier Inc[employer]  

## 2019-10-16 NOTE — Addendum Note (Signed)
Addended by: Phillip Heal, Deke Tilghman A on: 10/16/2019 03:24 PM   Modules accepted: Orders

## 2019-10-18 LAB — CERVICOVAGINAL ANCILLARY ONLY
Bacterial Vaginitis (gardnerella): NEGATIVE
Candida Glabrata: POSITIVE — AB
Candida Vaginitis: NEGATIVE
Comment: NEGATIVE
Comment: NEGATIVE
Comment: NEGATIVE
Comment: NEGATIVE
Trichomonas: NEGATIVE

## 2019-10-21 ENCOUNTER — Encounter: Payer: Self-pay | Admitting: Gastroenterology

## 2019-10-21 ENCOUNTER — Telehealth: Payer: Self-pay

## 2019-10-21 ENCOUNTER — Other Ambulatory Visit: Payer: Self-pay

## 2019-10-21 DIAGNOSIS — B373 Candidiasis of vulva and vagina: Secondary | ICD-10-CM

## 2019-10-21 DIAGNOSIS — B3731 Acute candidiasis of vulva and vagina: Secondary | ICD-10-CM

## 2019-10-21 MED ORDER — FLUCONAZOLE 150 MG PO TABS: 150.0000 mg | ORAL_TABLET | Freq: Once | ORAL | 3 refills | Status: DC

## 2019-10-21 MED ORDER — FLUCONAZOLE 150 MG PO TABS: 150.0000 mg | ORAL_TABLET | Freq: Once | ORAL | 3 refills | Status: AC

## 2019-10-21 MED FILL — FLUCONAZOLE 150 MG TABS: 150 | 1 days supply | Qty: 1 | Fill #0

## 2019-10-21 NOTE — Telephone Encounter (Signed)
-----   Message from Tarry Kos sent at 10/21/2019  9:26 AM EST -----  ----- Message ----- From: Lavonia Drafts, MD Sent: 10/21/2019   9:17 AM EST To: [facility]  Please call pt. She has a yeast on her PAP.   Rx at [facility].   Thx,  Clh-S

## 2019-10-21 NOTE — Telephone Encounter (Signed)
Patient made aware that she does have a yeast infection. Patient would like medication sent to [facility]. Patient made aware there are refills on the diflucan prescription for her. Kathrene Alu RN

## 2019-10-21 NOTE — Addendum Note (Signed)
Addended by: Lavonia Drafts on: 10/21/2019 09:33 AM   Modules accepted: Orders

## 2019-10-21 NOTE — Telephone Encounter (Signed)
Left message for patient to return call to office for results. Jennifer Howard RN  

## 2019-10-23 LAB — CYTOLOGY - PAP
Comment: NEGATIVE
Diagnosis: NEGATIVE
High risk HPV: NEGATIVE

## 2019-10-24 ENCOUNTER — Encounter: Payer: Self-pay | Admitting: Family Medicine

## 2019-10-24 ENCOUNTER — Other Ambulatory Visit: Payer: Self-pay | Admitting: Obstetrics & Gynecology

## 2019-10-24 ENCOUNTER — Telehealth: Payer: Self-pay | Admitting: Obstetrics & Gynecology

## 2019-10-24 MED ORDER — FLUCONAZOLE 150 MG PO TABS: 150.0000 mg | ORAL_TABLET | Freq: Once | ORAL | 3 refills | Status: AC

## 2019-10-24 MED FILL — FLUCONAZOLE 150 MG TABS: 150 | 1 days supply | Qty: 1 | Fill #0

## 2019-10-24 NOTE — Telephone Encounter (Signed)
Chart opened in error

## 2019-10-24 NOTE — Addendum Note (Signed)
Addended by: Lavonia Drafts on: 10/24/2019 04:12 PM   Modules accepted: Orders

## 2019-11-07 ENCOUNTER — Ambulatory Visit (AMBULATORY_SURGERY_CENTER): Payer: Commercial Managed Care - PPO | Admitting: *Deleted

## 2019-11-07 ENCOUNTER — Encounter: Payer: Self-pay | Admitting: Gastroenterology

## 2019-11-07 ENCOUNTER — Ambulatory Visit: Payer: Commercial Managed Care - PPO | Admitting: Obstetrics & Gynecology

## 2019-11-07 ENCOUNTER — Other Ambulatory Visit: Payer: Self-pay

## 2019-11-07 VITALS — Temp 96.8°F | Ht 62.0 in | Wt 119.8 lb

## 2019-11-07 DIAGNOSIS — Z1211 Encounter for screening for malignant neoplasm of colon: Secondary | ICD-10-CM

## 2019-11-07 DIAGNOSIS — Z1159 Encounter for screening for other viral diseases: Secondary | ICD-10-CM

## 2019-11-07 MED ORDER — NA SULFATE-K SULFATE-MG SULF 17.5-3.13-1.6 GM/177ML PO SOLN: 1.0000 | Freq: Once | ORAL | 0 refills | Status: AC

## 2019-11-07 NOTE — Progress Notes (Signed)

## 2019-11-20 ENCOUNTER — Encounter: Payer: Commercial Managed Care - PPO | Admitting: Gastroenterology

## 2019-12-12 ENCOUNTER — Ambulatory Visit (HOSPITAL_BASED_OUTPATIENT_CLINIC_OR_DEPARTMENT_OTHER)
Admission: RE | Admit: 2019-12-12 | Discharge: 2019-12-12 | Disposition: A | Discharge status: Home / Self Care | Payer: Commercial Managed Care - PPO | Source: Ambulatory Visit | Attending: Obstetrics & Gynecology | Admitting: Obstetrics & Gynecology

## 2019-12-12 ENCOUNTER — Other Ambulatory Visit: Payer: Self-pay

## 2019-12-12 DIAGNOSIS — Z1231 Encounter for screening mammogram for malignant neoplasm of breast: Secondary | ICD-10-CM | POA: Insufficient documentation

## 2019-12-24 ENCOUNTER — Encounter: Payer: Managed Care, Other (non HMO) | Admitting: Gastroenterology

## 2019-12-30 ENCOUNTER — Telehealth: Payer: Self-pay | Admitting: Family Medicine

## 2019-12-30 MED ORDER — LEVOTHYROXINE SODIUM 50 MCG PO TABS: 50.0000 ug | ORAL_TABLET | Freq: Every day | ORAL | 0 refills | Status: DC

## 2019-12-30 NOTE — Telephone Encounter (Signed)
Medication Refill - Medication: levothyroxine (SYNTHROID, LEVOTHROID) 50 MCG tablet    Has the patient contacted their pharmacy? Yes.   (Agent: If no, request that the patient contact the pharmacy for the refill.) (Agent: If yes, when and what did the pharmacy advise?)  Preferred Pharmacy (with phone number or street name):  [facility] - [place], [place] - [id number] Phone:  [phone]  Fax:  [phone]       Agent: Please be advised that RX refills may take up to 3 business days. We ask that you follow-up with your pharmacy.

## 2019-12-31 ENCOUNTER — Encounter: Payer: Self-pay | Admitting: Family Medicine

## 2019-12-31 ENCOUNTER — Other Ambulatory Visit: Payer: Self-pay | Admitting: Family Medicine

## 2019-12-31 DIAGNOSIS — E039 Hypothyroidism, unspecified: Secondary | ICD-10-CM

## 2019-12-31 MED ORDER — LEVOTHYROXINE SODIUM 50 MCG PO TABS: 50.0000 ug | ORAL_TABLET | Freq: Every day | ORAL | 0 refills | Status: DC

## 2019-12-31 NOTE — Telephone Encounter (Signed)
FYI

## 2019-12-31 NOTE — Telephone Encounter (Signed)
Called pt and she declined to schedule appt stating that her job ran labs for her in November and she needed refills. Advised pt she is due for follow and lab work. I told her she can send [place] copies of her lab work from November and Dr. Laury Axon can determine if they are suitable. Advised pt she still needs a follow up appt (virtual or in office). She will send the results first.

## 2019-12-31 NOTE — Telephone Encounter (Signed)
I refilled it for 90 days but she still needs ov with [place] for cpe------no pelvic  Esp since she is 49 years old

## 2020-01-01 NOTE — Telephone Encounter (Signed)
Sent below to pt via mychart.

## 2020-01-03 ENCOUNTER — Encounter: Payer: Self-pay | Admitting: Family Medicine

## 2020-01-06 ENCOUNTER — Other Ambulatory Visit: Payer: Self-pay

## 2020-01-07 ENCOUNTER — Other Ambulatory Visit: Payer: Self-pay

## 2020-01-07 ENCOUNTER — Ambulatory Visit (INDEPENDENT_AMBULATORY_CARE_PROVIDER_SITE_OTHER): Payer: Managed Care, Other (non HMO) | Admitting: Family Medicine

## 2020-01-07 ENCOUNTER — Encounter: Payer: Self-pay | Admitting: Family Medicine

## 2020-01-07 VITALS — BP 108/62 | HR 68 | Temp 97.7°F | Resp 18 | Ht 62.0 in | Wt 121.8 lb

## 2020-01-07 DIAGNOSIS — E559 Vitamin D deficiency, unspecified: Secondary | ICD-10-CM | POA: Diagnosis not present

## 2020-01-07 DIAGNOSIS — Z23 Encounter for immunization: Secondary | ICD-10-CM | POA: Diagnosis not present

## 2020-01-07 DIAGNOSIS — E039 Hypothyroidism, unspecified: Secondary | ICD-10-CM

## 2020-01-07 DIAGNOSIS — Z0000 Encounter for general adult medical examination without abnormal findings: Secondary | ICD-10-CM | POA: Diagnosis not present

## 2020-01-07 MED ORDER — LEVOTHYROXINE SODIUM 50 MCG PO TABS: 50.0000 ug | ORAL_TABLET | Freq: Every day | ORAL | 1 refills | Status: DC

## 2020-01-07 NOTE — Progress Notes (Signed)
Subjective:     Kirsten Ramirez is a 49 y.o. female and is here for a comprehensive physical exam. The patient reports no problems.  Social History   Socioeconomic History  . Marital status: Married    Spouse name: Not on file  . Number of children: 2  . Years of education: Not on file  . Highest education level: Not on file  Occupational History  . 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 Smoking status: Never Smoker  . Smokeless tobacco: Never Used  Substance and Sexual Activity  . Alcohol use: Yes    Alcohol/week: 0.0 standard drinks    Comment: only very rare and very little around holidays.  . Drug use: No  . Sexual activity: Yes    Partners: Male  Other Topics Concern  . Not on file  Social History Narrative   Exercise-- walks in am   Social Determinants of Health   Financial Resource Strain:   . Difficulty of Paying Living Expenses: Not on file  Food Insecurity:   . Worried About Charity fundraiser in the Last Year: Not on file  . Ran Out of Food in the Last Year: Not on file  Transportation Needs:   . Lack of Transportation (Medical): Not on file  . Lack of Transportation (Non-Medical): Not on file  Physical Activity:   . Days of Exercise per Week: Not on file  . Minutes of Exercise per Session: Not on file  Stress:   . Feeling of Stress : Not on file  Social Connections:   . Frequency of Communication with Friends and Family: Not on file  . Frequency of Social Gatherings with Friends and Family: Not on file  . Attends Religious Services: Not on file  . Active Member of Clubs or Organizations: Not on file  . Attends Archivist Meetings: Not on file  . Marital Status: Not on file  Intimate Partner Violence:   . Fear of Current or Ex-Partner: Not on file  . Emotionally Abused: Not on file  . Physically Abused: Not on file  . Sexually Abused: Not on file   Health Maintenance  Topic Date Due  . HIV Screening  09/14/1984  . COLONOSCOPY   09/15/2019  . MAMMOGRAM  12/11/2020  . PAP SMEAR-Modifier  10/15/2022  . TETANUS/TDAP  12/22/2022  . INFLUENZA VACCINE  Completed    The following portions of the patient's history were reviewed and updated as appropriate: She  has a past medical history of Allergy and Thyroid disease. She does not have any pertinent problems on file. She  has a past surgical history that includes Appendectomy (1992); Cesarean section (2009, 2011); and Tubal ligation. Her family history includes Cancer in her father; Hypertension in her father and mother. She  reports that she has never smoked. She has never used smokeless tobacco. She reports current alcohol use. She reports that she does not use drugs. She has a current medication list which includes the following prescription(s): levothyroxine. No current outpatient medications on file prior to visit.   No current facility-administered medications on file prior to visit.   She has No Known Allergies..  Review of Systems Review of Systems  Constitutional: Negative for activity change, appetite change and fatigue.  HENT: Negative for hearing loss, congestion, tinnitus and ear discharge.  dentist q58m Eyes: Negative for visual disturbance (see optho q1y -- vision corrected to 20/20 with glasses).  Respiratory: Negative for cough, chest  tightness and shortness of breath.   Cardiovascular: Negative for chest pain, palpitations and leg swelling.  Gastrointestinal: Negative for abdominal pain, diarrhea, constipation and abdominal distention.  Genitourinary: Negative for urgency, frequency, decreased urine volume and difficulty urinating.  Musculoskeletal: Negative for back pain, arthralgias and gait problem.  Skin: Negative for color change, pallor and rash.  Neurological: Negative for dizziness, light-headedness, numbness and headaches.  Hematological: Negative for adenopathy. Does not bruise/bleed easily.  Psychiatric/Behavioral: Negative for suicidal  ideas, confusion, sleep disturbance, self-injury, dysphoric mood, decreased concentration and agitation.       Objective:    BP 108/62 (BP Location: Right Arm, Patient Position: Sitting, Cuff Size: Normal)   Pulse 68   Temp 97.7 F (36.5 C) (Temporal)   Resp 18   Ht 5\' 2"  (1.575 m)   Wt 121 lb 12.8 oz (55.2 kg)   SpO2 98%   BMI 22.28 kg/m  General appearance: alert, cooperative, appears stated age and no distress Head: Normocephalic, without obvious abnormality, atraumatic Eyes: negative findings: lids and lashes normal, conjunctivae and sclerae normal and pupils equal, round, reactive to light and accomodation Ears: normal TM's and external ear canals both ears Neck: no adenopathy, no carotid bruit, no JVD, supple, symmetrical, trachea midline and thyroid not enlarged, symmetric, no tenderness/mass/nodules Back: symmetric, no curvature. ROM normal. No CVA tenderness. Lungs: clear to auscultation bilaterally Breasts: gyn Heart: regular rate and rhythm, S1, S2 normal, no murmur, click, rub or gallop Abdomen: soft, non-tender; bowel sounds normal; no masses,  no organomegaly Pelvic: deferred--gyn Extremities: extremities normal, atraumatic, no cyanosis or edema Pulses: 2+ and symmetric Skin: Skin color, texture, turgor normal. No rashes or lesions Lymph nodes: Cervical, supraclavicular, and axillary nodes normal. Neurologic: Alert and oriented X 3, normal strength and tone. Normal symmetric reflexes. Normal coordination and gait    Assessment:    Healthy female exam.      Plan:    ghm utd Check labs  See After Visit Summary for Counseling Recommendations   Colonoscopy is scheduled  1. Hypothyroidism, unspecified type Labs reviewed -- pt brought labs in from [facility] - levothyroxine (SYNTHROID) 50 MCG tablet; Take 1 tablet (50 mcg total) by mouth daily.  Dispense: 90 tablet; Refill: 1  2. Preventative health care See above   3. Vitamin D deficiency Vita d3 1000u  daily  - Vitamin D (25 hydroxy); Future  4. Need for shingles vaccine #1 given  rto 2 months for #2  - Varicella-zoster vaccine IM

## 2020-01-07 NOTE — Patient Instructions (Addendum)
COVID-19 Vaccine Information can be found at: [email or web address] For questions related to vaccine distribution or appointments, please email [email or web address] or call [phone].       Preventive Care 40-51 Years Old, Female Preventive care refers to visits with your health care provider and lifestyle choices that can promote health and wellness. This includes:  A yearly physical exam. This may also be called an annual well check.  Regular dental visits and eye exams.  Immunizations.  Screening for certain conditions.  Healthy lifestyle choices, such as eating a healthy diet, getting regular exercise, not using drugs or products that contain nicotine and tobacco, and limiting alcohol use. What can I expect for my preventive care visit? Physical exam Your health care provider will check your:  Height and weight. This may be used to calculate body mass index (BMI), which tells if you are at a healthy weight.  Heart rate and blood pressure.  Skin for abnormal spots. Counseling Your health care provider may ask you questions about your:  Alcohol, tobacco, and drug use.  Emotional well-being.  Home and relationship well-being.  Sexual activity.  Eating habits.  Work and work environment.  Method of birth control.  Menstrual cycle.  Pregnancy history. What immunizations do I need?  Influenza (flu) vaccine  This is recommended every year. Tetanus, diphtheria, and pertussis (Tdap) vaccine  You may need a Td booster every 10 years. Varicella (chickenpox) vaccine  You may need this if you have not been vaccinated. Zoster (shingles) vaccine  You may need this after age 60. Measles, mumps, and rubella (MMR) vaccine  You may need at least one dose of MMR if you were born in 1957 or later. You may also need a second dose. Pneumococcal conjugate (PCV13) vaccine  You may need this if you have certain  conditions and were not previously vaccinated. Pneumococcal polysaccharide (PPSV23) vaccine  You may need one or two doses if you smoke cigarettes or if you have certain conditions. Meningococcal conjugate (MenACWY) vaccine  You may need this if you have certain conditions. Hepatitis A vaccine  You may need this if you have certain conditions or if you travel or work in places where you may be exposed to hepatitis A. Hepatitis B vaccine  You may need this if you have certain conditions or if you travel or work in places where you may be exposed to hepatitis B. Haemophilus influenzae type b (Hib) vaccine  You may need this if you have certain conditions. Human papillomavirus (HPV) vaccine  If recommended by your health care provider, you may need three doses over 6 months. You may receive vaccines as individual doses or as more than one vaccine together in one shot (combination vaccines). Talk with your health care provider about the risks and benefits of combination vaccines. What tests do I need? Blood tests  Lipid and cholesterol levels. These may be checked every 5 years, or more frequently if you are over 50 years old.  Hepatitis C test.  Hepatitis B test. Screening  Lung cancer screening. You may have this screening every year starting at age 55 if you have a 30-pack-year history of smoking and currently smoke or have quit within the past 15 years.  Colorectal cancer screening. All adults should have this screening starting at age 50 and continuing until age 75. Your health care provider may recommend screening at age 45 if you are at increased risk. You will have tests every 1-10 years, depending on your results and the type   and the type of screening test.  Diabetes screening. This is done by checking your blood sugar (glucose) after you have not eaten for a while (fasting). You may have this done every 1-3 years.  Mammogram. This may be done every 1-2 years. Talk with your  health care provider about when you should start having regular mammograms. This may depend on whether you have a family history of breast cancer.  BRCA-related cancer screening. This may be done if you have a family history of breast, ovarian, tubal, or peritoneal cancers.  Pelvic exam and Pap test. This may be done every 3 years starting at age 48. Starting at age 60, this may be done every 5 years if you have a Pap test in combination with an HPV test. Other tests  Sexually transmitted disease (STD) testing.  Bone density scan. This is done to screen for osteoporosis. You may have this scan if you are at high risk for osteoporosis. Follow these instructions at home: Eating and drinking  Eat a diet that includes fresh fruits and vegetables, whole grains, lean protein, and low-fat dairy.  Take vitamin and mineral supplements as recommended by your health care provider.  Do not drink alcohol if: ? Your health care provider tells you not to drink. ? You are pregnant, may be pregnant, or are planning to become pregnant.  If you drink alcohol: ? Limit how much you have to 0-1 drink a day. ? Be aware of how much alcohol is in your drink. In the [place] one drink equals one 12 oz bottle of beer (355 mL), one 5 oz glass of wine (148 mL), or one 1 oz glass of hard liquor (44 mL). Lifestyle  Take daily care of your teeth and gums.  Stay active. Exercise for at least 30 minutes on 5 or more days each week.  Do not use any products that contain nicotine or tobacco, such as cigarettes, e-cigarettes, and chewing tobacco. If you need help quitting, ask your health care provider.  If you are sexually active, practice safe sex. Use a condom or other form of birth control (contraception) in order to prevent pregnancy and STIs (sexually transmitted infections).  If told by your health care provider, take low-dose aspirin daily starting at age 59. What's next?  Visit your health care provider once  a year for a well check visit.  Ask your health care provider how often you should have your eyes and teeth checked.  Stay up to date on all vaccines. This information is not intended to replace advice given to you by your health care provider. Make sure you discuss any questions you have with your health care provider. Document Revised: 08/16/2018 Document Reviewed: 08/16/2018 Elsevier Patient Education  2020 [employer]  Preventive Care 73-93 Years Old, Female Preventive care refers to visits with your health care provider and lifestyle choices that can promote health and wellness. This includes:  A yearly physical exam. This may also be called an annual well check.  Regular dental visits and eye exams.  Immunizations.  Screening for certain conditions.  Healthy lifestyle choices, such as eating a healthy diet, getting regular exercise, not using drugs or products that contain nicotine and tobacco, and limiting alcohol use. What can I expect for my preventive care visit? Physical exam Your health care provider will check your:  Height and weight. This may be used to calculate body mass index (BMI), which tells if you are at a healthy weight.  Heart rate  and blood pressure.  Skin for abnormal spots. Counseling Your health care provider may ask you questions about your:  Alcohol, tobacco, and drug use.  Emotional well-being.  Home and relationship well-being.  Sexual activity.  Eating habits.  Work and work Statistician.  Method of birth control.  Menstrual cycle.  Pregnancy history. What immunizations do I need?  Influenza (flu) vaccine  This is recommended every year. Tetanus, diphtheria, and pertussis (Tdap) vaccine  You may need a Td booster every 10 years. Varicella (chickenpox) vaccine  You may need this if you have not been vaccinated. Zoster (shingles) vaccine  You may need this after age 22. Measles, mumps, and rubella (MMR) vaccine  You  may need at least one dose of MMR if you were born in 1957 or later. You may also need a second dose. Pneumococcal conjugate (PCV13) vaccine  You may need this if you have certain conditions and were not previously vaccinated. Pneumococcal polysaccharide (PPSV23) vaccine  You may need one or two doses if you smoke cigarettes or if you have certain conditions. Meningococcal conjugate (MenACWY) vaccine  You may need this if you have certain conditions. Hepatitis A vaccine  You may need this if you have certain conditions or if you travel or work in places where you may be exposed to hepatitis A. Hepatitis B vaccine  You may need this if you have certain conditions or if you travel or work in places where you may be exposed to hepatitis B. Haemophilus influenzae type b (Hib) vaccine  You may need this if you have certain conditions. Human papillomavirus (HPV) vaccine  If recommended by your health care provider, you may need three doses over 6 months. You may receive vaccines as individual doses or as more than one vaccine together in one shot (combination vaccines). Talk with your health care provider about the risks and benefits of combination vaccines. What tests do I need? Blood tests  Lipid and cholesterol levels. These may be checked every 5 years, or more frequently if you are over 8 years old.  Hepatitis C test.  Hepatitis B test. Screening  Lung cancer screening. You may have this screening every year starting at age 51 if you have a 30-pack-year history of smoking and currently smoke or have quit within the past 15 years.  Colorectal cancer screening. All adults should have this screening starting at age 75 and continuing until age 35. Your health care provider may recommend screening at age 78 if you are at increased risk. You will have tests every 1-10 years, depending on your results and the type of screening test.  Diabetes screening. This is done by checking your  blood sugar (glucose) after you have not eaten for a while (fasting). You may have this done every 1-3 years.  Mammogram. This may be done every 1-2 years. Talk with your health care provider about when you should start having regular mammograms. This may depend on whether you have a family history of breast cancer.  BRCA-related cancer screening. This may be done if you have a family history of breast, ovarian, tubal, or peritoneal cancers.  Pelvic exam and Pap test. This may be done every 3 years starting at age 71. Starting at age 52, this may be done every 5 years if you have a Pap test in combination with an HPV test. Other tests  Sexually transmitted disease (STD) testing.  Bone density scan. This is done to screen for osteoporosis. You may have this scan  if you are at high risk for osteoporosis. Follow these instructions at home: Eating and drinking  Eat a diet that includes fresh fruits and vegetables, whole grains, lean protein, and low-fat dairy.  Take vitamin and mineral supplements as recommended by your health care provider.  Do not drink alcohol if: ? Your health care provider tells you not to drink. ? You are pregnant, may be pregnant, or are planning to become pregnant.  If you drink alcohol: ? Limit how much you have to 0-1 drink a day. ? Be aware of how much alcohol is in your drink. In the [place] one drink equals one 12 oz bottle of beer (355 mL), one 5 oz glass of wine (148 mL), or one 1 oz glass of hard liquor (44 mL). Lifestyle  Take daily care of your teeth and gums.  Stay active. Exercise for at least 30 minutes on 5 or more days each week.  Do not use any products that contain nicotine or tobacco, such as cigarettes, e-cigarettes, and chewing tobacco. If you need help quitting, ask your health care provider.  If you are sexually active, practice safe sex. Use a condom or other form of birth control (contraception) in order to prevent pregnancy and STIs  (sexually transmitted infections).  If told by your health care provider, take low-dose aspirin daily starting at age 63. What's next?  Visit your health care provider once a year for a well check visit.  Ask your health care provider how often you should have your eyes and teeth checked.  Stay up to date on all vaccines. This information is not intended to replace advice given to you by your health care provider. Make sure you discuss any questions you have with your health care provider. Document Revised: 08/16/2018 Document Reviewed: 08/16/2018 Elsevier Patient Education  2020 [employer].

## 2020-01-22 ENCOUNTER — Other Ambulatory Visit: Payer: Self-pay | Admitting: Gastroenterology

## 2020-01-22 ENCOUNTER — Ambulatory Visit (INDEPENDENT_AMBULATORY_CARE_PROVIDER_SITE_OTHER): Payer: Managed Care, Other (non HMO)

## 2020-01-22 DIAGNOSIS — Z1159 Encounter for screening for other viral diseases: Secondary | ICD-10-CM

## 2020-01-22 LAB — SARS CORONAVIRUS 2 (TAT 6-24 HRS): SARS Coronavirus 2: NEGATIVE

## 2020-01-24 ENCOUNTER — Encounter: Payer: Self-pay | Admitting: Gastroenterology

## 2020-01-24 ENCOUNTER — Ambulatory Visit (AMBULATORY_SURGERY_CENTER): Payer: Managed Care, Other (non HMO) | Admitting: Gastroenterology

## 2020-01-24 ENCOUNTER — Other Ambulatory Visit: Payer: Self-pay

## 2020-01-24 VITALS — BP 108/62 | HR 67 | Temp 97.3°F | Resp 13 | Ht 62.0 in | Wt 119.8 lb

## 2020-01-24 DIAGNOSIS — Z1211 Encounter for screening for malignant neoplasm of colon: Secondary | ICD-10-CM

## 2020-01-24 MED ORDER — SODIUM CHLORIDE 0.9 % IV SOLN: 500.0000 mL | Freq: Once | INTRAVENOUS | Status: DC

## 2020-01-24 NOTE — Progress Notes (Signed)
Pt tolerated well. VSS. Awake and to recovery. 

## 2020-01-24 NOTE — Progress Notes (Signed)
Temp by LC, vitals by DT 

## 2020-01-24 NOTE — Patient Instructions (Signed)
Your exam was normal and you don't need another colonoscopy for 10 years.  YOU HAD AN ENDOSCOPIC PROCEDURE TODAY AT THE [facility]:   Refer to the procedure report that was given to you for any specific questions about what was found during the examination.  If the procedure report does not answer your questions, please call your gastroenterologist to clarify.  If you requested that your care partner not be given the details of your procedure findings, then the procedure report has been included in a sealed envelope for you to review at your convenience later.  YOU SHOULD EXPECT: Some feelings of bloating in the abdomen. Passage of more gas than usual.  Walking can help get rid of the air that was put into your GI tract during the procedure and reduce the bloating. If you had a lower endoscopy (such as a colonoscopy or flexible sigmoidoscopy) you may notice spotting of blood in your stool or on the toilet paper. If you underwent a bowel prep for your procedure, you may not have a normal bowel movement for a few days.  Please Note:  You might notice some irritation and congestion in your nose or some drainage.  This is from the oxygen used during your procedure.  There is no need for concern and it should clear up in a day or so.  SYMPTOMS TO REPORT IMMEDIATELY:   Following lower endoscopy (colonoscopy or flexible sigmoidoscopy):  Excessive amounts of blood in the stool  Significant tenderness or worsening of abdominal pains  Swelling of the abdomen that is new, acute  Fever of 100F or higher   For urgent or emergent issues, a gastroenterologist can be reached at any hour by calling [phone].   DIET:  We do recommend a small meal at first, but then you may proceed to your regular diet.  Drink plenty of fluids but you should avoid alcoholic beverages for 24 hours.  ACTIVITY:  You should plan to take it easy for the rest of today and you should NOT DRIVE or use heavy  machinery until tomorrow (because of the sedation medicines used during the test).    FOLLOW UP: Our staff will call the number listed on your records 48-72 hours following your procedure to check on you and address any questions or concerns that you may have regarding the information given to you following your procedure. If we do not reach you, we will leave a message.  We will attempt to reach you two times.  During this call, we will ask if you have developed any symptoms of COVID 19. If you develop any symptoms (ie: fever, flu-like symptoms, shortness of breath, cough etc.) before then, please call [phone].  If you test positive for Covid 19 in the 2 weeks post procedure, please call and report this information to [place].    If any biopsies were taken you will be contacted by phone or by letter within the next 1-3 weeks.  Please call us at [phone] if you have not heard about the biopsies in 3 weeks.    SIGNATURES/CONFIDENTIALITY: You and/or your care partner have signed paperwork which will be entered into your electronic medical record.  These signatures attest to the fact that that the information above on your After Visit Summary has been reviewed and is understood.  Full responsibility of the confidentiality of this discharge information lies with you and/or your care-partner.

## 2020-01-24 NOTE — Op Note (Signed)
[facility] Patient Name: Kirsten Ramirez Procedure Date: 01/24/2020 2:08 PM MRN: [id number] Endoscopist: Napoleon Form , MD Age: 49 Referring MD:  Date of Birth: [birth year] Gender: Female Account #: [id number] Procedure:                Colonoscopy Indications:              Screening for colorectal malignant neoplasm Medicines:                Monitored Anesthesia Care Procedure:                Pre-Anesthesia Assessment:                           - Prior to the procedure, a History and Physical                            was performed, and patient medications and                            allergies were reviewed. The patient's tolerance of                            previous anesthesia was also reviewed. The risks                            and benefits of the procedure and the sedation                            options and risks were discussed with the patient.                            All questions were answered, and informed consent                            was obtained. Prior Anticoagulants: The patient has                            taken no previous anticoagulant or antiplatelet                            agents. ASA Grade Assessment: II - A patient with                            mild systemic disease. After reviewing the risks                            and benefits, the patient was deemed in                            satisfactory condition to undergo the procedure.                           After obtaining informed consent, the colonoscope  was passed under direct vision. Throughout the                            procedure, the patient's blood pressure, pulse, and                            oxygen saturations were monitored continuously. The                            Colonoscope was introduced through the anus and                            advanced to the the cecum, identified by                            appendiceal orifice and  ileocecal valve. The                            colonoscopy was performed without difficulty. The                            patient tolerated the procedure well. The quality                            of the bowel preparation was excellent. The                            ileocecal valve, appendiceal orifice, and rectum                            were photographed. Scope In: 2:10:54 PM Scope Out: 2:26:17 PM Scope Withdrawal Time: 0 hours 8 minutes 8 seconds  Total Procedure Duration: 0 hours 15 minutes 23 seconds  Findings:                 The perianal and digital rectal examinations were                            normal.                           The entire examined colon appeared normal. Complications:            No immediate complications. Estimated Blood Loss:     Estimated blood loss: none. Impression:               - The entire examined colon is normal.                           - No specimens collected. Recommendation:           - Patient has a contact number available for                            emergencies. The signs and symptoms of potential  delayed complications were discussed with the                            patient. Return to normal activities tomorrow.                            Written discharge instructions were provided to the                            patient.                           - Resume previous diet.                           - Continue present medications.                           - Repeat colonoscopy in 10 years for screening                            purposes. Mauri Pole, MD 01/24/2020 2:30:26 PM This report has been signed electronically.

## 2020-01-28 ENCOUNTER — Telehealth: Payer: Self-pay

## 2020-01-28 ENCOUNTER — Telehealth: Payer: Self-pay | Admitting: *Deleted

## 2020-01-28 NOTE — Telephone Encounter (Signed)
No answer, left message to call back later today, B.Jenyfer Trawick RN. 

## 2020-01-28 NOTE — Telephone Encounter (Signed)
  Follow up Call-  Call back number 01/24/2020  Post procedure Call Back phone  # [phone]  Permission to leave phone message Yes  Some recent data might be hidden     Patient questions:  Do you have a fever, pain , or abdominal swelling? No. Pain Score  0 *  Have you tolerated food without any problems? Yes.    Have you been able to return to your normal activities? Yes.    Do you have any questions about your discharge instructions: Diet   No. Medications  No. Follow up visit  No.  Do you have questions or concerns about your Care? No.  Actions: * If pain score is 4 or above: 1. No action needed, pain <4.Have you developed a fever since your procedure? no  2.   Have you had an respiratory symptoms (SOB or cough) since your procedure? no  3.   Have you tested positive for COVID 19 since your procedure no  4.   Have you had any family members/close contacts diagnosed with the COVID 19 since your procedure?  no   If yes to any of these questions please route to Laverna Peace, RN and Jennye Boroughs, Charity fundraiser.

## 2020-02-27 ENCOUNTER — Other Ambulatory Visit: Payer: Self-pay | Admitting: Family Medicine

## 2020-02-27 ENCOUNTER — Encounter: Payer: Self-pay | Admitting: Family Medicine

## 2020-02-27 DIAGNOSIS — E538 Deficiency of other specified B group vitamins: Secondary | ICD-10-CM

## 2020-02-27 NOTE — Telephone Encounter (Signed)
Order added.

## 2020-02-28 ENCOUNTER — Other Ambulatory Visit: Payer: Self-pay | Admitting: Family Medicine

## 2020-02-28 DIAGNOSIS — L659 Nonscarring hair loss, unspecified: Secondary | ICD-10-CM

## 2020-02-28 DIAGNOSIS — E039 Hypothyroidism, unspecified: Secondary | ICD-10-CM

## 2020-02-28 NOTE — Telephone Encounter (Signed)
Orders are in

## 2020-02-28 NOTE — Telephone Encounter (Signed)
Dr Zola Button -- I called pt re: lab appt reminder / screening and she wanted to know if low vit b12 or vit d could cause hair loss? Pt also has hypothyroidism and was last checked in November. Is there anything else you would like to check at her upcoming lab appt?

## 2020-03-02 ENCOUNTER — Other Ambulatory Visit: Payer: Managed Care, Other (non HMO)

## 2020-03-04 ENCOUNTER — Other Ambulatory Visit: Payer: Self-pay

## 2020-03-04 ENCOUNTER — Other Ambulatory Visit (INDEPENDENT_AMBULATORY_CARE_PROVIDER_SITE_OTHER): Payer: Managed Care, Other (non HMO)

## 2020-03-04 DIAGNOSIS — E559 Vitamin D deficiency, unspecified: Secondary | ICD-10-CM | POA: Diagnosis not present

## 2020-03-04 DIAGNOSIS — L659 Nonscarring hair loss, unspecified: Secondary | ICD-10-CM

## 2020-03-04 DIAGNOSIS — E039 Hypothyroidism, unspecified: Secondary | ICD-10-CM | POA: Diagnosis not present

## 2020-03-04 LAB — CBC WITH DIFFERENTIAL/PLATELET
Basophils Absolute: 0 10*3/uL (ref 0.0–0.1)
Basophils Relative: 0.7 % (ref 0.0–3.0)
Eosinophils Absolute: 0.1 10*3/uL (ref 0.0–0.7)
Eosinophils Relative: 1.6 % (ref 0.0–5.0)
HCT: 39.4 % (ref 36.0–46.0)
Hemoglobin: 13.3 g/dL (ref 12.0–15.0)
Lymphocytes Relative: 32.9 % (ref 12.0–46.0)
Lymphs Abs: 1.7 10*3/uL (ref 0.7–4.0)
MCHC: 33.9 g/dL (ref 30.0–36.0)
MCV: 89.2 fl (ref 78.0–100.0)
Monocytes Absolute: 0.3 10*3/uL (ref 0.1–1.0)
Monocytes Relative: 5.7 % (ref 3.0–12.0)
Neutro Abs: 3.1 10*3/uL (ref 1.4–7.7)
Neutrophils Relative %: 59.1 % (ref 43.0–77.0)
Platelets: 259 10*3/uL (ref 150.0–400.0)
RBC: 4.42 Mil/uL (ref 3.87–5.11)
RDW: 12.6 % (ref 11.5–15.5)
WBC: 5.2 10*3/uL (ref 4.0–10.5)

## 2020-03-04 LAB — LIPID PANEL
Cholesterol: 155 mg/dL (ref 0–200)
HDL: 56.2 mg/dL (ref >39.00)
LDL Cholesterol: 84 mg/dL (ref 0–99)
NonHDL: 98.34
Total CHOL/HDL Ratio: 3
Triglycerides: 73 mg/dL (ref 0.0–149.0)
VLDL: 14.6 mg/dL (ref 0.0–40.0)

## 2020-03-04 LAB — COMPREHENSIVE METABOLIC PANEL
ALT: 12 U/L (ref 0–35)
AST: 12 U/L (ref 0–37)
Albumin: 4.3 g/dL (ref 3.5–5.2)
Alkaline Phosphatase: 38 U/L — ABNORMAL LOW (ref 39–117)
BUN: 17 mg/dL (ref 6–23)
CO2: 31 mEq/L (ref 19–32)
Calcium: 9.1 mg/dL (ref 8.4–10.5)
Chloride: 103 mEq/L (ref 96–112)
Creatinine, Ser: 0.66 mg/dL (ref 0.40–1.20)
GFR: 94.62 mL/min (ref >60.00)
Glucose, Bld: 93 mg/dL (ref 70–99)
Potassium: 4.1 mEq/L (ref 3.5–5.1)
Sodium: 138 mEq/L (ref 135–145)
Total Bilirubin: 0.7 mg/dL (ref 0.2–1.2)
Total Protein: 6.7 g/dL (ref 6.0–8.3)

## 2020-03-04 LAB — VITAMIN D 25 HYDROXY (VIT D DEFICIENCY, FRACTURES): VITD: 26.75 ng/mL — ABNORMAL LOW (ref 30.00–100.00)

## 2020-03-04 LAB — VITAMIN B12: Vitamin B-12: >1500 pg/mL — ABNORMAL HIGH (ref 211–911)

## 2020-03-04 LAB — TSH: TSH: 0.83 u[IU]/mL (ref 0.35–4.50)

## 2020-03-05 MED ORDER — VITAMIN D (ERGOCALCIFEROL) 1.25 MG (50000 UNIT) PO CAPS: 50000.0000 [IU] | ORAL_CAPSULE | ORAL | 0 refills | Status: DC

## 2020-03-05 NOTE — Addendum Note (Signed)
Addended byConrad Pavillion D on: 03/05/2020 03:17 PM   Modules accepted: Orders

## 2020-03-09 ENCOUNTER — Encounter: Payer: Self-pay | Admitting: Family Medicine

## 2020-03-09 NOTE — Telephone Encounter (Signed)
Pts recent labs showed B12 >1500   Please advise

## 2020-03-09 NOTE — Telephone Encounter (Signed)
Usually the b12 is excreted in the urine If she is taking any b supp she can stop

## 2020-07-14 ENCOUNTER — Other Ambulatory Visit: Payer: Self-pay | Admitting: Family Medicine

## 2020-07-14 DIAGNOSIS — E039 Hypothyroidism, unspecified: Secondary | ICD-10-CM

## 2020-08-26 ENCOUNTER — Telehealth: Payer: Self-pay | Admitting: Family Medicine

## 2020-08-26 NOTE — Telephone Encounter (Signed)
Caller: [place] Call back phone # [phone]  Patient states she wants to know why she was charged for lab on 03/04/20. Patient states she had provided it lab results, there were no needs to re-draw labs.

## 2020-10-28 NOTE — Telephone Encounter (Signed)
I sent an email to [employer] asking them to look into this issue for the patient. °

## 2020-12-25 ENCOUNTER — Other Ambulatory Visit: Payer: Self-pay

## 2020-12-25 ENCOUNTER — Other Ambulatory Visit: Payer: Managed Care, Other (non HMO)

## 2020-12-25 DIAGNOSIS — Z20822 Contact with and (suspected) exposure to covid-19: Secondary | ICD-10-CM

## 2020-12-29 LAB — NOVEL CORONAVIRUS, NAA: SARS-CoV-2, NAA: DETECTED — AB

## 2020-12-30 ENCOUNTER — Encounter: Payer: Self-pay | Admitting: Family Medicine

## 2021-01-05 NOTE — Telephone Encounter (Signed)
Wellness with RN-?--- I'm not in office tomorrow

## 2021-01-07 ENCOUNTER — Ambulatory Visit (INDEPENDENT_AMBULATORY_CARE_PROVIDER_SITE_OTHER): Payer: Managed Care, Other (non HMO) | Admitting: Family Medicine

## 2021-01-07 ENCOUNTER — Other Ambulatory Visit (HOSPITAL_BASED_OUTPATIENT_CLINIC_OR_DEPARTMENT_OTHER): Payer: Self-pay | Admitting: Family Medicine

## 2021-01-07 ENCOUNTER — Ambulatory Visit (HOSPITAL_BASED_OUTPATIENT_CLINIC_OR_DEPARTMENT_OTHER)
Admission: RE | Admit: 2021-01-07 | Discharge: 2021-01-07 | Disposition: A | Discharge status: Home / Self Care | Payer: Managed Care, Other (non HMO) | Source: Ambulatory Visit | Attending: Family Medicine | Admitting: Family Medicine

## 2021-01-07 ENCOUNTER — Other Ambulatory Visit: Payer: Self-pay

## 2021-01-07 ENCOUNTER — Encounter: Payer: Self-pay | Admitting: Family Medicine

## 2021-01-07 VITALS — BP 98/70 | HR 75 | Temp 97.8°F | Resp 18 | Ht 62.0 in | Wt 117.8 lb

## 2021-01-07 DIAGNOSIS — R109 Unspecified abdominal pain: Secondary | ICD-10-CM

## 2021-01-07 DIAGNOSIS — Z1231 Encounter for screening mammogram for malignant neoplasm of breast: Secondary | ICD-10-CM | POA: Insufficient documentation

## 2021-01-07 DIAGNOSIS — Z0000 Encounter for general adult medical examination without abnormal findings: Secondary | ICD-10-CM

## 2021-01-07 DIAGNOSIS — E039 Hypothyroidism, unspecified: Secondary | ICD-10-CM | POA: Diagnosis not present

## 2021-01-07 LAB — POC URINALSYSI DIPSTICK (AUTOMATED)
Bilirubin, UA: NEGATIVE
Blood, UA: NEGATIVE
Glucose, UA: NEGATIVE
Ketones, UA: NEGATIVE
Leukocytes, UA: NEGATIVE
Nitrite, UA: NEGATIVE
Protein, UA: NEGATIVE
Spec Grav, UA: 1.015 (ref 1.010–1.025)
Urobilinogen, UA: 0.2 E.U./dL
pH, UA: 7 (ref 5.0–8.0)

## 2021-01-07 MED ORDER — LEVOTHYROXINE SODIUM 50 MCG PO TABS: 50.0000 ug | ORAL_TABLET | Freq: Every day | ORAL | 1 refills | Status: DC

## 2021-01-07 NOTE — Patient Instructions (Signed)
Preventive Care 52-52 Years Old Old, Female Preventive care refers to lifestyle choices and visits with your health care provider that can promote health and wellness. This includes:  A yearly physical exam. This is also called an annual wellness visit.  Regular dental and eye exams.  Immunizations.  Screening for certain conditions.  Healthy lifestyle choices, such as: ? Eating a healthy diet. ? Getting regular exercise. ? Not using drugs or products that contain nicotine and tobacco. ? Limiting alcohol use. What can I expect for my preventive care visit? Physical exam Your health care provider will check your:  Height and weight. These may be used to calculate your BMI (body mass index). BMI is a measurement that tells if you are at a healthy weight.  Heart rate and blood pressure.  Body temperature.  Skin for abnormal spots. Counseling Your health care provider may ask you questions about your:  Past medical problems.  Family's medical history.  Alcohol, tobacco, and drug use.  Emotional well-being.  Home life and relationship well-being.  Sexual activity.  Diet, exercise, and sleep habits.  Work and work Statistician.  Access to firearms.  Method of birth control.  Menstrual cycle.  Pregnancy history. What immunizations do I need? Vaccines are usually given at various ages, according to a schedule. Your health care provider will recommend vaccines for you based on your age, medical history, and lifestyle or other factors, such as travel or where you work.   What tests do I need? Blood tests  Lipid and cholesterol levels. These may be checked every 5 years, or more often if you are over 3 years old.  Hepatitis C test.  Hepatitis B test. Screening  Lung cancer screening. You may have this screening every year starting at age 52 if you have a 30-pack-year history of smoking and currently smoke or have quit within the past 15 years.  Colorectal cancer  screening. ? All adults should have this screening starting at age 52 and continuing until age 17. ? Your health care provider may recommend screening at age 49 if you are at increased risk. ? You will have tests every 1-10 years, depending on your results and the type of screening test.  Diabetes screening. ? This is done by checking your blood sugar (glucose) after you have not eaten for a while (fasting). ? You may have this done every 1-3 years.  Mammogram. ? This may be done every 1-2 years. ? Talk with your health care provider about when you should start having regular mammograms. This may depend on whether you have a family history of breast cancer.  BRCA-related cancer screening. This may be done if you have a family history of breast, ovarian, tubal, or peritoneal cancers.  Pelvic exam and Pap test. ? This may be done every 3 years starting at age 52. ? Starting at age 11, this may be done every 5 years if you have a Pap test in combination with an HPV test. Other tests  STD (sexually transmitted disease) testing, if you are at risk.  Bone density scan. This is done to screen for osteoporosis. You may have this scan if you are at high risk for osteoporosis. Talk with your health care provider about your test results, treatment options, and if necessary, the need for more tests. Follow these instructions at home: Eating and drinking  Eat a diet that includes fresh fruits and vegetables, whole grains, lean protein, and low-fat dairy products.  Take vitamin and mineral supplements  as recommended by your health care provider.  Do not drink alcohol if: ? Your health care provider tells you not to drink. ? You are pregnant, may be pregnant, or are planning to become pregnant.  If you drink alcohol: ? Limit how much you have to 0-1 drink a day. ? Be aware of how much alcohol is in your drink. In the [place] one drink equals one 12 oz bottle of beer (355 mL), one 5 oz glass of  wine (148 mL), or one 1 oz glass of hard liquor (44 mL).   Lifestyle  Take daily care of your teeth and gums. Brush your teeth every morning and night with fluoride toothpaste. Floss one time each day.  Stay active. Exercise for at least 30 minutes 5 or more days each week.  Do not use any products that contain nicotine or tobacco, such as cigarettes, e-cigarettes, and chewing tobacco. If you need help quitting, ask your health care provider.  Do not use drugs.  If you are sexually active, practice safe sex. Use a condom or other form of protection to prevent STIs (sexually transmitted infections).  If you do not wish to become pregnant, use a form of birth control. If you plan to become pregnant, see your health care provider for a prepregnancy visit.  If told by your health care provider, take low-dose aspirin daily starting at age 50.  Find healthy ways to cope with stress, such as: ? Meditation, yoga, or listening to music. ? Journaling. ? Talking to a trusted person. ? Spending time with friends and family. Safety  Always wear your seat belt while driving or riding in a vehicle.  Do not drive: ? If you have been drinking alcohol. Do not ride with someone who has been drinking. ? When you are tired or distracted. ? While texting.  Wear a helmet and other protective equipment during sports activities.  If you have firearms in your house, make sure you follow all gun safety procedures. What's next?  Visit your health care provider once a year for an annual wellness visit.  Ask your health care provider how often you should have your eyes and teeth checked.  Stay up to date on all vaccines. This information is not intended to replace advice given to you by your health care provider. Make sure you discuss any questions you have with your health care provider. Document Revised: 09/08/2020 Document Reviewed: 08/16/2018 Elsevier Patient Education  2021 Elsevier Inc.  

## 2021-01-07 NOTE — Progress Notes (Signed)
Subjective:     Kirsten Ramirez is a 50 y.o. female and is here for a comprehensive physical exam. The patient reports no problems.  Social History   Socioeconomic History  . Marital status: Married    Spouse name: Not on file  . Number of children: 2  . Years of education: Not on file  . Highest education level: Not on file  Occupational History  . 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 Smoking status: Never Smoker  . Smokeless tobacco: Never Used  Substance and Sexual Activity  . Alcohol use: Yes    Alcohol/week: 0.0 standard drinks    Comment: only very rare and very little around holidays.  . Drug use: No  . Sexual activity: Yes    Partners: Male  Other Topics Concern  . Not on file  Social History Narrative   Exercise-- walks in am   Social Determinants of Health   Financial Resource Strain: Not on file  Food Insecurity: Not on file  Transportation Needs: Not on file  Physical Activity: Not on file  Stress: Not on file  Social Connections: Not on file  Intimate Partner Violence: Not on file   Health Maintenance  Topic Date Due  . Hepatitis C Screening  Never done  . HIV Screening  Never done  . INFLUENZA VACCINE  07/19/2020  . COVID-19 Vaccine (3 - Booster for Pfizer series) 10/02/2020  . MAMMOGRAM  12/11/2020  . PAP SMEAR-Modifier  10/15/2022  . TETANUS/TDAP  12/22/2022  . COLONOSCOPY (Pts 45-24yrs Insurance coverage will need to be confirmed)  01/23/2030    The following portions of the patient's history were reviewed and updated as appropriate:  She  has a past medical history of Allergy and Thyroid disease. She does not have any pertinent problems on file. She  has a past surgical history that includes Appendectomy (1992); Cesarean section (2009, 2011); and Tubal ligation. Her family history includes Cancer in her father; Hypertension in her father and mother. She  reports that she has never smoked. She has never used smokeless tobacco. She  reports current alcohol use. She reports that she does not use drugs. She has a current medication list which includes the following prescription(s): vitamin d (ergocalciferol) and levothyroxine. Current Outpatient Medications on File Prior to Visit  Medication Sig Dispense Refill  . Vitamin D, Ergocalciferol, (DRISDOL) 1.25 MG (50000 UNIT) CAPS capsule Take 1 capsule (50,000 Units total) by mouth every 7 (seven) days. 12 capsule 0   No current facility-administered medications on file prior to visit.   She has No Known Allergies..  Review of Systems Review of Systems  Constitutional: Negative for activity change, appetite change and fatigue.  HENT: Negative for hearing loss, congestion, tinnitus and ear discharge.  dentist q43m Eyes: Negative for visual disturbance (see optho q1y -- vision corrected to 20/20 with glasses).  Respiratory: Negative for cough, chest tightness and shortness of breath.   Cardiovascular: Negative for chest pain, palpitations and leg swelling.  Gastrointestinal: Negative for abdominal pain, diarrhea, constipation and abdominal distention.  Genitourinary: Negative for urgency, frequency, decreased urine volume and difficulty urinating.  Musculoskeletal: Negative for, arthralgias and gait problem. + back pain Skin: Negative for color change, pallor and rash.  Neurological: Negative for dizziness, light-headedness, numbness and headaches.  Hematological: Negative for adenopathy. Does not bruise/bleed easily.  Psychiatric/Behavioral: Negative for suicidal ideas, confusion, sleep disturbance, self-injury, dysphoric mood, decreased concentration and agitation.  Objective:    BP 98/70 (BP Location: Right Arm, Patient Position: Sitting, Cuff Size: Normal)   Pulse 75   Temp 97.8 F (36.6 C) (Oral)   Resp 18   Ht 5\' 2"  (1.575 m)   Wt 117 lb 12.8 oz (53.4 kg)   SpO2 96%   BMI 21.55 kg/m  General appearance: alert, cooperative, appears stated age and no  distress Head: Normocephalic, without obvious abnormality, atraumatic Eyes: conjunctivae/corneas clear. PERRL, EOM's intact. Fundi benign. Ears: normal TM's and external ear canals both ears Neck: no adenopathy, no carotid bruit, no JVD, supple, symmetrical, trachea midline and thyroid not enlarged, symmetric, no tenderness/mass/nodules Back: symmetric, no curvature. ROM normal. No CVA tenderness. Lungs: clear to auscultation bilaterally Breasts: gyn Heart: regular rate and rhythm, S1, S2 normal, no murmur, click, rub or gallop Abdomen: soft, non-tender; bowel sounds normal; no masses,  no organomegaly Pelvic: deferred--gyn Extremities: extremities normal, atraumatic, no cyanosis or edema Pulses: 2+ and symmetric Skin: Skin color, texture, turgor normal. No rashes or lesions Lymph nodes: Cervical, supraclavicular, and axillary nodes normal. Neurologic: Alert and oriented X 3, normal strength and tone. Normal symmetric reflexes. Normal coordination and gait    Assessment:    Healthy female exam.      Plan:    ghm utd Check labs  See After Visit Summary for Counseling Recommendations    1. Preventative health care See above   2. Hypothyroidism, unspecified type Pt brought in labs from work  tsh normal  - levothyroxine (SYNTHROID) 50 MCG tablet; Take 1 tablet (50 mcg total) by mouth daily.  Dispense: 90 tablet; Refill: 1  3. Left flank pain   - POCT Urinalysis Dipstick (Automated)

## 2021-01-13 ENCOUNTER — Encounter: Payer: Self-pay | Admitting: Family Medicine

## 2021-01-13 NOTE — Telephone Encounter (Signed)
Pictures attached.

## 2021-01-13 NOTE — Progress Notes (Unsigned)
OFFICE VISIT  01/14/2021  CC:  Chief Complaint  Patient presents with  . Bump on right hand    Occurred yesterday afternoon, painful if pressure applied.   HPI:    Patient is a 50 y.o. female who presents for bump on right hand. Onset yesterday afternoon, focal swelling over top of hand 2nd metacarpal region, hurts some to touch.  Was initially focal and now has become a bit more sutbtle/ diffuse.  No preceding trauma but had been vigorously cleaning cabinets for a couple hours same day. No similar areas on other parts of body. No blood in stool or urine.  No nosebleeds.   Past Medical History:  Diagnosis Date  . Allergy   . Thyroid disease     Past Surgical History:  Procedure Laterality Date  . APPENDECTOMY  1992  . CESAREAN SECTION  2009, 2011  . TUBAL LIGATION      Outpatient Medications Prior to Visit  Medication Sig Dispense Refill  . levothyroxine (SYNTHROID) 50 MCG tablet Take 1 tablet (50 mcg total) by mouth daily. 90 tablet 1  . Vitamin D, Ergocalciferol, (DRISDOL) 1.25 MG (50000 UNIT) CAPS capsule Take 1 capsule (50,000 Units total) by mouth every 7 (seven) days. (Patient not taking: Reported on 01/14/2021) 12 capsule 0   No facility-administered medications prior to visit.    No Known Allergies  ROS As per HPI  PE: Vitals with BMI 01/14/2021 01/07/2021 01/24/2020  Height 5\' 2"  5\' 2"  -  Weight 118 lbs 3 oz 117 lbs 13 oz -  BMI 21.61 21.54 -  Systolic 106 98 108  Diastolic 68 70 62  Pulse 64 75 67     Gen: Alert, well appearing.  Patient is oriented to person, place, time, and situation. AFFECT: pleasant, lucid thought and speech. R hand dorsal aspect of 2nd metacarpal has diffuse, mild STS and I can palpate a pea-sized firm nodule in the center.  Mild ecchymoses in the area of STS. Mild TTP over the involved area. ROM wrist, hand, fingers all intact.  Radial pulse 2+.  Hand/fingers warm and w/out cyanosis.  LABS:    Chemistry      Component Value  Date/Time   NA 138 03/04/2020 1031   K 4.1 03/04/2020 1031   CL 103 03/04/2020 1031   CO2 31 03/04/2020 1031   BUN 17 03/04/2020 1031   CREATININE 0.66 03/04/2020 1031      Component Value Date/Time   CALCIUM 9.1 03/04/2020 1031   ALKPHOS 38 (L) 03/04/2020 1031   AST 12 03/04/2020 1031   ALT 12 03/04/2020 1031   BILITOT 0.7 03/04/2020 1031     IMPRESSION AND PLAN:  Acute R hand swelling and pain--Suspect spont rupture ganglion cyst. Would like to get ultrasound of the involved area--ordered for [facility] and hopefully to be scheduled for today or tomorrow. Discussed ice, relative rest, no meds needed.  Spent 20 min with pt today reviewing HPI, reviewing relevant past history, doing exam, reviewing and discussing lab and imaging data, and formulating plans.   An After Visit Summary was printed and given to the patient.  FOLLOW UP: No follow-ups on file.  Signed:  03/06/2020, MD           01/14/2021

## 2021-01-13 NOTE — Telephone Encounter (Signed)
Ov as long as covid screen neg

## 2021-01-14 ENCOUNTER — Encounter: Payer: Self-pay | Admitting: Family Medicine

## 2021-01-14 ENCOUNTER — Ambulatory Visit: Payer: Managed Care, Other (non HMO) | Admitting: Family Medicine

## 2021-01-14 ENCOUNTER — Telehealth: Payer: Self-pay

## 2021-01-14 ENCOUNTER — Other Ambulatory Visit: Payer: Self-pay

## 2021-01-14 VITALS — BP 106/68 | HR 64 | Temp 97.4°F | Resp 16 | Ht 62.0 in | Wt 118.2 lb

## 2021-01-14 DIAGNOSIS — M6740 Ganglion, unspecified site: Secondary | ICD-10-CM

## 2021-01-14 DIAGNOSIS — M7989 Other specified soft tissue disorders: Secondary | ICD-10-CM | POA: Diagnosis not present

## 2021-01-14 NOTE — Telephone Encounter (Signed)
Pt is scheduled w/ Dr. Milinda Cave today at 9am.

## 2021-01-14 NOTE — Patient Instructions (Signed)
Apply ice to the top of right hand for 15 minutes twice per day.  You do not need to take any medication for this problem.

## 2021-01-14 NOTE — Telephone Encounter (Signed)
Pt didn't want to wait for appointment. Pt states going to [facility]

## 2021-01-14 NOTE — Telephone Encounter (Signed)
Nurse Assessment Nurse: Elesa Hacker, RN, [name] Date/Time [name] Time): 01/13/2021 4:03:00 PM Confirm and document reason for call. If symptomatic, describe symptoms. ---Caller states she back of her right hand is swollen with big bump. It happened suddenly. No known injury. Advised that she has a bump , the size of a quarter. Just noticed it today. No known insect, spider bite. Advised that the color looks bruised. No temp. No pain if she does not press on the location. Does the patient have any new or worsening symptoms? ---Yes Will a triage be completed? ---Yes Related visit to physician within the last 2 weeks? ---No Does the PT have any chronic conditions? (i.e. diabetes, asthma, this includes High risk factors for pregnancy, etc.) ---Yes List chronic conditions. ---hypothyroidism Is the patient pregnant or possibly pregnant? (Ask all females between the ages of 46-55) ---No Is this a behavioral health or substance abuse call? ---No Guidelines Guideline Title Affirmed Question Affirmed Notes Nurse Date/Time (Eastern Time) Skin Lump or Localized Swelling [1] Swelling is painful to touch AND [2] no fever Deaton, RN, [name] 01/13/2021 4:06:18 PM Disp. Time [name] Time) Disposition Final User 01/13/2021 4:08:07 PM See PCP within 24 Hours Yes Deaton, RN, [name] PLEASE NOTE: All timestamps contained within this report are represented as [place] Standard Time. CONFIDENTIALTY NOTICE: This fax transmission is intended only for the addressee. It contains information that is legally privileged, confidential or otherwise protected from use or disclosure. If you are not the intended recipient, you are strictly prohibited from reviewing, disclosing, copying using or disseminating any of this information or taking any action in reliance on or regarding this information. If you have received this fax in error, please notify us immediately by telephone so that we can arrange for its return to [place]. Phone:  [phone], Toll-Free: [phone], Fax: [phone] Page: 2 of 2 Call Id: [id number] Caller Disagree/Comply Comply Caller Understands Yes PreDisposition Did not know what to do Care Advice Given Per Guideline SEE PCP WITHIN 24 HOURS: * IF OFFICE WILL BE OPEN: You need to be examined within the next 24 hours. Call your doctor (or NP/PA) when the office opens and make an appointment. * IBUPROFEN (E.G., MOTRIN, ADVIL): Take 400 mg (two 200 mg pills) by mouth every 6 hours. The most you should take each day is 1,200 mg (six 200 mg pills), unless your doctor has told you to take more. CALL BACK IF: * Severe pain occurs * Fever occurs * You become worse CARE ADVICE given per Skin Swelling or Lump (Adult) guideline. Comments User: Wandra Scot, RN Date/Time [name] Time): 01/13/2021 4:10:30 PM Caller advised that she called the office and no appointments available tomorrow. Advised that she will go to [facility]. Referrals REFERRED TO PCP OFFICE

## 2021-01-14 NOTE — Telephone Encounter (Signed)
Seen by Dr Marvel Plan

## 2021-01-14 NOTE — Telephone Encounter (Signed)
Note states patient will go to [facility]

## 2021-01-15 ENCOUNTER — Ambulatory Visit (HOSPITAL_BASED_OUTPATIENT_CLINIC_OR_DEPARTMENT_OTHER)
Admission: RE | Admit: 2021-01-15 | Discharge: 2021-01-15 | Disposition: A | Discharge status: Home / Self Care | Payer: Managed Care, Other (non HMO) | Source: Ambulatory Visit | Attending: Family Medicine | Admitting: Family Medicine

## 2021-01-15 DIAGNOSIS — M6740 Ganglion, unspecified site: Secondary | ICD-10-CM | POA: Diagnosis present

## 2021-01-15 DIAGNOSIS — M7989 Other specified soft tissue disorders: Secondary | ICD-10-CM | POA: Diagnosis not present

## 2021-01-18 ENCOUNTER — Other Ambulatory Visit: Payer: Self-pay

## 2021-01-18 DIAGNOSIS — M6740 Ganglion, unspecified site: Secondary | ICD-10-CM

## 2021-01-18 NOTE — Telephone Encounter (Signed)
The results went to Dr Cheyenne Adas since you saw him It looks like an inflamed ganglion cyst--- refer [facility] Amanda Pea for further evaluation

## 2021-01-20 NOTE — Telephone Encounter (Signed)
Do you think this needs to be changed to a urgent referral?

## 2021-04-12 ENCOUNTER — Other Ambulatory Visit: Payer: Self-pay | Admitting: Family Medicine

## 2021-04-12 DIAGNOSIS — E039 Hypothyroidism, unspecified: Secondary | ICD-10-CM

## 2021-04-15 ENCOUNTER — Encounter: Payer: Self-pay | Admitting: Family Medicine

## 2021-04-26 MED ORDER — LEVOTHYROXINE SODIUM 25 MCG PO TABS
25.0000 ug | ORAL_TABLET | Freq: Every day | ORAL | 0 refills | Status: DC
Start: 2021-04-26 — End: 2021-09-01

## 2021-07-13 ENCOUNTER — Other Ambulatory Visit: Payer: Self-pay

## 2021-07-13 ENCOUNTER — Encounter: Payer: Self-pay | Admitting: Family Medicine

## 2021-07-13 ENCOUNTER — Ambulatory Visit: Payer: Managed Care, Other (non HMO) | Admitting: Family Medicine

## 2021-07-13 VITALS — BP 117/71 | HR 64 | Temp 97.6°F | Wt 119.0 lb

## 2021-07-13 DIAGNOSIS — R350 Frequency of micturition: Secondary | ICD-10-CM

## 2021-07-13 DIAGNOSIS — R8290 Unspecified abnormal findings in urine: Secondary | ICD-10-CM

## 2021-07-13 LAB — POCT URINALYSIS DIPSTICK
Bilirubin, UA: NEGATIVE
Glucose, UA: NEGATIVE
Ketones, UA: NEGATIVE
Nitrite, UA: NEGATIVE
Protein, UA: NEGATIVE
Spec Grav, UA: 1.015 (ref 1.010–1.025)
Urobilinogen, UA: 0.2 E.U./dL
pH, UA: 6 (ref 5.0–8.0)

## 2021-07-13 MED ORDER — SULFAMETHOXAZOLE-TRIMETHOPRIM 800-160 MG PO TABS: 1.0000 | ORAL_TABLET | Freq: Two times a day (BID) | ORAL | 0 refills | Status: DC

## 2021-07-13 NOTE — Progress Notes (Signed)
This visit occurred during the SARS-CoV-2 public health emergency.  Safety protocols were in place, including screening questions prior to the visit, additional usage of staff PPE, and extensive cleaning of exam room while observing appropriate contact time as indicated for disinfecting solutions.    Kirsten Ramirez , 08-23-1971, 50 y.o., female MRN: [id number] Patient Care Team    Relationship Specialty Notifications Start End  Zola Button, Grayling Congress, DO PCP - General Family Medicine  09/25/15   Napoleon Form, MD Consulting Physician Gastroenterology  01/07/20   Willodean Rosenthal, MD Consulting Physician Obstetrics and Gynecology  01/07/20     Chief Complaint  Patient presents with   Dysuria    Pt c/o urine urgency, urine freq, dysuria x 1 day;     Subjective: Pt presents for an OV with complaints of urinary frequency and dysuria of 1-2 d duration. She denies fever, chills, lower back pain, hematuria or nausea. She states she had a small "pimple" on her labia last week, she is uncertain if that is why she has a urinary frequency now. She has denies current redness or drainage at that particular site. She states she has had this once before.  She reports mild lower abd pressure and occasional urinary incontinence and urgency-which seems to be chronic.  Depression screen [facility] 2/9 01/07/2021 01/07/2020  Decreased Interest 0 0  Down, Depressed, Hopeless 0 0  PHQ - 2 Score 0 0    No Known Allergies Social History   Social History Narrative   Exercise-- walks in am   Past Medical History:  Diagnosis Date   Allergy    Thyroid disease    Past Surgical History:  Procedure Laterality Date   APPENDECTOMY  1992   CESAREAN SECTION  2009, 2011   TUBAL LIGATION     Family History  Problem Relation Age of Onset   Hypertension Mother    Hypertension Father    Cancer Father        colon, liver   Colon cancer Neg Hx    Esophageal cancer Neg Hx    Rectal cancer Neg Hx    Stomach  cancer Neg Hx    Allergies as of 07/13/2021   No Known Allergies      Medication List        Accurate as of July 13, 2021 11:59 PM. If you have any questions, ask your nurse or doctor.          STOP taking these medications    Vitamin D (Ergocalciferol) 1.25 MG (50000 UNIT) Caps capsule Commonly known as: DRISDOL Stopped by: Felix Pacini, DO       TAKE these medications    levothyroxine 25 MCG tablet Commonly known as: SYNTHROID Take 1 tablet (25 mcg total) by mouth daily.   sulfamethoxazole-trimethoprim 800-160 MG tablet Commonly known as: BACTRIM DS Take 1 tablet by mouth 2 (two) times daily. Started by: Felix Pacini, DO        All past medical history, surgical history, allergies, family history, immunizations andmedications were updated in the EMR today and reviewed under the history and medication portions of their EMR.     ROS: Negative, with the exception of above mentioned in HPI   Objective:  BP 117/71   Pulse 64   Temp 97.6 F (36.4 C) (Oral)   Wt 119 lb (54 kg)   SpO2 99%   BMI 21.77 kg/m  Body mass index is 21.77 kg/m. Gen: Afebrile. No acute distress. Nontoxic  in appearance, well developed, well nourished. Pleasant female.  HENT: AT. [place].  Eyes:Pupils Equal Round Reactive to light, Extraocular movements intact,  Conjunctiva without redness, discharge or icterus. Abd: Soft. Mild suprapubic pressure. ND. BS + MSK: no cva tenderness Neuro: Normal gait. PERLA. EOMi. Alert. Oriented x3    No results found. No results found. Results for orders placed or performed in visit on 07/13/21 (from the past 24 hour(s))  POCT urinalysis dipstick     Status: Abnormal   Collection Time: 07/13/21  3:17 PM  Result Value Ref Range   Color, UA yellow    Clarity, UA clear    Glucose, UA Negative Negative   Bilirubin, UA negative    Ketones, UA negative    Spec Grav, UA 1.015 1.010 - 1.025   Blood, UA 1+    pH, UA 6.0 5.0 - 8.0   Protein, UA Negative  Negative   Urobilinogen, UA 0.2 0.2 or 1.0 E.U./dL   Nitrite, UA negative    Leukocytes, UA Trace (A) Negative   Appearance     Odor      Assessment/Plan: [name] is a 50 y.o. female present for OV for  Abnormal urine/urinary frequency Discussed findings with patient today point-of-care has trace leukocytes and +1 blood.  Will send for urine culture. She did report a remote ?  Abscess on her labia, she states its mostly healed and not concerning to her.  We will treat prophylactically for UTI with Bactrim, to cover possible abscess as well. - Urinalysis w microscopic + reflex cultur - POCT urinalysis dipstick Patient was encouraged to follow-up with her gynecologist or PCP if symptoms are not improving with antibiotic use, sooner if worsening.  She reports understanding.  Reviewed expectations re: course of current medical issues. Discussed self-management of symptoms. Outlined signs and symptoms indicating need for more acute intervention. Patient verbalized understanding and all questions were answered. Patient received an After-Visit Summary.    Orders Placed This Encounter  Procedures   Urinalysis w microscopic + reflex cultur   POCT urinalysis dipstick    Meds ordered this encounter  Medications   sulfamethoxazole-trimethoprim (BACTRIM DS) 800-160 MG tablet    Sig: Take 1 tablet by mouth 2 (two) times daily.    Dispense:  10 tablet    Refill:  0    Referral Orders  No referral(s) requested today     Note is dictated utilizing voice recognition software. Although note has been proof read prior to signing, occasional typographical errors still can be missed. If any questions arise, please do not hesitate to call for verification.   electronically signed by:  Felix Pacini, DO  [facility] [place]

## 2021-07-13 NOTE — Patient Instructions (Addendum)
Try Azo medication. This is over the counter. This only helps with symptoms.   We will send the urine for culture and call you with culture results.    Urinary Tract Infection, Adult A urinary tract infection (UTI) is an infection of any part of the urinary tract. The urinary tract includes: The kidneys. The ureters. The bladder. The urethra. These organs make, store, and get rid of pee (urine) in the body. What are the causes? This infection is caused by germs (bacteria) in your genital area. These germs grow and cause swelling (inflammation) of your urinary tract. What increases the risk? The following factors may make you more likely to develop this condition: Using a small, thin tube (catheter) to drain pee. Not being able to control when you pee or poop (incontinence). Being female. If you are female, these things can increase the risk: Using these methods to prevent pregnancy: A medicine that kills sperm (spermicide). A device that blocks sperm (diaphragm). Having low levels of a female hormone (estrogen). Being pregnant. You are more likely to develop this condition if: You have genes that add to your risk. You are sexually active. You take antibiotic medicines. You have trouble peeing because of: A prostate that is bigger than normal, if you are female. A blockage in the part of your body that drains pee from the bladder. A kidney stone. A nerve condition that affects your bladder. Not getting enough to drink. Not peeing often enough. You have other conditions, such as: Diabetes. A weak disease-fighting system (immune system). Sickle cell disease. Gout. Injury of the spine. What are the signs or symptoms? Symptoms of this condition include: Needing to pee right away. Peeing small amounts often. Pain or burning when peeing. Blood in the pee. Pee that smells bad or not like normal. Trouble peeing. Pee that is cloudy. Fluid coming from the vagina, if you are  female. Pain in the belly or lower back. Other symptoms include: Vomiting. Not feeling hungry. Feeling mixed up (confused). This may be the first symptom in older adults. Being tired and grouchy (irritable). A fever. Watery poop (diarrhea). How is this treated? Taking antibiotic medicine. Taking other medicines. Drinking enough water. In some cases, you may need to see a specialist. Follow these instructions at home:  Medicines Take over-the-counter and prescription medicines only as told by your doctor. If you were prescribed an antibiotic medicine, take it as told by your doctor. Do not stop taking it even if you start to feel better. General instructions Make sure you: Pee until your bladder is empty. Do not hold pee for a long time. Empty your bladder after sex. Wipe from front to back after peeing or pooping if you are a female. Use each tissue one time when you wipe. Drink enough fluid to keep your pee pale yellow. Keep all follow-up visits. Contact a doctor if: You do not get better after 1-2 days. Your symptoms go away and then come back. Get help right away if: You have very bad back pain. You have very bad pain in your lower belly. You have a fever. You have chills. You feeling like you will vomit or you vomit. Summary A urinary tract infection (UTI) is an infection of any part of the urinary tract. This condition is caused by germs in your genital area. There are many risk factors for a UTI. Treatment includes antibiotic medicines. Drink enough fluid to keep your pee pale yellow. This information is not intended to replace advice  given to you by your health care provider. Make sure you discuss any questions you have with your healthcare provider. Document Revised: 07/17/2020 Document Reviewed: 07/17/2020 Elsevier Patient Education  2022 [employer].

## 2021-07-14 ENCOUNTER — Ambulatory Visit: Payer: Managed Care, Other (non HMO) | Admitting: Family Medicine

## 2021-07-16 ENCOUNTER — Telehealth: Payer: Self-pay | Admitting: Family Medicine

## 2021-07-16 LAB — URINE CULTURE
MICRO NUMBER:: 12169348
SPECIMEN QUALITY:: ADEQUATE

## 2021-07-16 LAB — URINALYSIS W MICROSCOPIC + REFLEX CULTURE
Bacteria, UA: NONE SEEN /HPF
Bilirubin Urine: NEGATIVE
Glucose, UA: NEGATIVE
Hgb urine dipstick: NEGATIVE
Ketones, ur: NEGATIVE
Nitrites, Initial: NEGATIVE
Protein, ur: NEGATIVE
RBC / HPF: NONE SEEN /HPF (ref 0–2)
Specific Gravity, Urine: 1.007 (ref 1.001–1.035)
pH: <=5 (ref 5.0–8.0)

## 2021-07-16 LAB — CULTURE INDICATED

## 2021-07-16 MED ORDER — NITROFURANTOIN MONOHYD MACRO 100 MG PO CAPS: 100.0000 mg | ORAL_CAPSULE | Freq: Two times a day (BID) | ORAL | 0 refills | Status: DC

## 2021-07-16 NOTE — Telephone Encounter (Signed)
Left message for pt to CB

## 2021-07-16 NOTE — Telephone Encounter (Signed)
Spoke with pt regarding labs and instructions.   

## 2021-07-16 NOTE — Telephone Encounter (Signed)
Please call patient: Her urinalysis did result with having E. coli UTI.  Unfortunately it is resistant to the medication we prescribed.  I have called in a new medication for her to start which is every 12 hours for 5 days.  The new medication is called Macrobid, and this will effectively treat her urinary tract infection.  She can discontinue the Bactrim.

## 2021-09-01 ENCOUNTER — Other Ambulatory Visit: Payer: Self-pay | Admitting: Family Medicine

## 2021-11-15 ENCOUNTER — Telehealth: Payer: Self-pay | Admitting: Family Medicine

## 2021-11-15 NOTE — Telephone Encounter (Signed)
Pt thinks she has a dislocated jaw and would like for someone to return call. [phone] or  [phone]. Please advise.

## 2021-11-15 NOTE — Telephone Encounter (Signed)
Spoke with patient. Pt advised that her jaw has popped back in place and is having some pain and swelling. I advised patient to alternate tylenol and ibuprofen for pain and using ice. I also advised if this happens again and she is unable to get jaw back in place to go to the ED and if it happens again and she is able to get her jaw back in place to come for a visit for a referral.  Pt was advised to eat to soft foods and to over stress her jaw.

## 2021-12-29 ENCOUNTER — Other Ambulatory Visit (HOSPITAL_BASED_OUTPATIENT_CLINIC_OR_DEPARTMENT_OTHER): Payer: Self-pay | Admitting: Family Medicine

## 2021-12-29 DIAGNOSIS — Z1231 Encounter for screening mammogram for malignant neoplasm of breast: Secondary | ICD-10-CM

## 2022-01-10 ENCOUNTER — Encounter (HOSPITAL_BASED_OUTPATIENT_CLINIC_OR_DEPARTMENT_OTHER): Payer: Self-pay

## 2022-01-10 ENCOUNTER — Encounter: Payer: Self-pay | Admitting: Family Medicine

## 2022-01-10 ENCOUNTER — Ambulatory Visit (INDEPENDENT_AMBULATORY_CARE_PROVIDER_SITE_OTHER): Payer: Managed Care, Other (non HMO) | Admitting: Family Medicine

## 2022-01-10 ENCOUNTER — Other Ambulatory Visit: Payer: Self-pay

## 2022-01-10 ENCOUNTER — Ambulatory Visit (HOSPITAL_BASED_OUTPATIENT_CLINIC_OR_DEPARTMENT_OTHER)
Admission: RE | Admit: 2022-01-10 | Discharge: 2022-01-10 | Disposition: A | Discharge status: Home / Self Care | Payer: Managed Care, Other (non HMO) | Source: Ambulatory Visit | Attending: Family Medicine | Admitting: Family Medicine

## 2022-01-10 VITALS — BP 100/70 | HR 78 | Temp 97.9°F | Resp 16 | Ht 62.0 in | Wt 115.6 lb

## 2022-01-10 DIAGNOSIS — E039 Hypothyroidism, unspecified: Secondary | ICD-10-CM

## 2022-01-10 DIAGNOSIS — Z1231 Encounter for screening mammogram for malignant neoplasm of breast: Secondary | ICD-10-CM | POA: Insufficient documentation

## 2022-01-10 DIAGNOSIS — Z23 Encounter for immunization: Secondary | ICD-10-CM | POA: Diagnosis not present

## 2022-01-10 DIAGNOSIS — Z0000 Encounter for general adult medical examination without abnormal findings: Secondary | ICD-10-CM | POA: Diagnosis not present

## 2022-01-10 LAB — LIPID PANEL
Cholesterol: 186 mg/dL (ref 0–200)
HDL: 54.2 mg/dL (ref >39.00)
LDL Cholesterol: 92 mg/dL (ref 0–99)
NonHDL: 131.49
Total CHOL/HDL Ratio: 3
Triglycerides: 199 mg/dL — ABNORMAL HIGH (ref 0.0–149.0)
VLDL: 39.8 mg/dL (ref 0.0–40.0)

## 2022-01-10 LAB — CBC WITH DIFFERENTIAL/PLATELET
Basophils Absolute: 0 10*3/uL (ref 0.0–0.1)
Basophils Relative: 0.6 % (ref 0.0–3.0)
Eosinophils Absolute: 0.1 10*3/uL (ref 0.0–0.7)
Eosinophils Relative: 1.3 % (ref 0.0–5.0)
HCT: 40.1 % (ref 36.0–46.0)
Hemoglobin: 13.2 g/dL (ref 12.0–15.0)
Lymphocytes Relative: 39.7 % (ref 12.0–46.0)
Lymphs Abs: 1.7 10*3/uL (ref 0.7–4.0)
MCHC: 32.8 g/dL (ref 30.0–36.0)
MCV: 87.7 fl (ref 78.0–100.0)
Monocytes Absolute: 0.2 10*3/uL (ref 0.1–1.0)
Monocytes Relative: 5.3 % (ref 3.0–12.0)
Neutro Abs: 2.3 10*3/uL (ref 1.4–7.7)
Neutrophils Relative %: 53.1 % (ref 43.0–77.0)
Platelets: 253 10*3/uL (ref 150.0–400.0)
RBC: 4.57 Mil/uL (ref 3.87–5.11)
RDW: 12.5 % (ref 11.5–15.5)
WBC: 4.3 10*3/uL (ref 4.0–10.5)

## 2022-01-10 LAB — COMPREHENSIVE METABOLIC PANEL
ALT: 12 U/L (ref 0–35)
AST: 13 U/L (ref 0–37)
Albumin: 4.7 g/dL (ref 3.5–5.2)
Alkaline Phosphatase: 37 U/L — ABNORMAL LOW (ref 39–117)
BUN: 16 mg/dL (ref 6–23)
CO2: 28 mEq/L (ref 19–32)
Calcium: 9.5 mg/dL (ref 8.4–10.5)
Chloride: 105 mEq/L (ref 96–112)
Creatinine, Ser: 0.72 mg/dL (ref 0.40–1.20)
GFR: 96.2 mL/min (ref >60.00)
Glucose, Bld: 88 mg/dL (ref 70–99)
Potassium: 4.1 mEq/L (ref 3.5–5.1)
Sodium: 141 mEq/L (ref 135–145)
Total Bilirubin: 1 mg/dL (ref 0.2–1.2)
Total Protein: 7 g/dL (ref 6.0–8.3)

## 2022-01-10 LAB — HEPATITIS C ANTIBODY
Hepatitis C Ab: NONREACTIVE
SIGNAL TO CUT-OFF: <0.02 (ref <1.00)

## 2022-01-10 LAB — TSH: TSH: 1.91 u[IU]/mL (ref 0.35–5.50)

## 2022-01-10 NOTE — Progress Notes (Signed)
Subjective:     Kirsten Ramirez is a 51 y.o. female and is here for a comprehensive physical exam. The patient reports problems - L shoulder pain---- it bothered her around Christmas but is better today.  She was concerned about frozen shoulder.   .  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   Drug use: No   Sexual activity: Yes    Partners: Male  Other Topics Concern   Not on file  Social History Narrative   Exercise-- walks in am   Social Determinants of Health   Financial Resource Strain: Not on file  Food Insecurity: Not on file  Transportation Needs: Not on file  Physical Activity: Not on file  Stress: Not on file  Social Connections: Not on file  Intimate Partner Violence: Not on file   Health Maintenance  Topic Date Due   HIV Screening  Never done   Hepatitis C Screening  Never done   MAMMOGRAM  01/07/2022   COVID-19 Vaccine (3 - Booster for [place] series) 01/26/2022 (Originally 05/28/2020)   PAP SMEAR-Modifier  10/15/2022   TETANUS/TDAP  12/22/2022   COLONOSCOPY (Pts 45-25yrs Insurance coverage will need to be confirmed)  01/23/2030   INFLUENZA VACCINE  Completed   Zoster Vaccines- Shingrix  Completed   HPV VACCINES  Aged Out    The following portions of the patient's history were reviewed and updated as appropriate: She  has a past medical history of Allergy and Thyroid disease. She does not have any pertinent problems on file. She  has a past surgical history that includes Appendectomy (1992); Cesarean section (2009, 2011); and Tubal  ligation. Her family history includes Cancer in her father; Hypertension in her father and mother; Stroke in her maternal aunt. She  reports that she has never smoked. She has never used smokeless tobacco. She reports current alcohol use. She reports that she does not use drugs. She has a current medication list which includes the following prescription(s): levothyroxine. Current Outpatient Medications on File Prior to Visit  Medication Sig Dispense Refill   levothyroxine (SYNTHROID) 25 MCG tablet TAKE 1 TABLET DAILY 90 tablet 0   No current facility-administered medications on file prior to visit.   She has No Known Allergies..  Review of Systems Review of Systems  Constitutional: Negative for activity change, appetite change and fatigue.  HENT: Negative for hearing loss, congestion, tinnitus and ear discharge.  dentist q68m Eyes: Negative for visual disturbance (see optho q1y -- vision corrected to 20/20 with glasses).  Respiratory: Negative for cough, chest tightness and shortness of breath.   Cardiovascular: Negative for chest pain, palpitations and leg swelling.  Gastrointestinal: Negative for abdominal pain, diarrhea, constipation and abdominal distention.  Genitourinary: Negative for urgency, frequency, decreased urine volume and difficulty urinating.  Musculoskeletal: Negative for back pain, arthralgias and gait problem.  Skin: Negative for color change, pallor and rash.  Neurological: Negative for dizziness, light-headedness, numbness and headaches.  Hematological: Negative for adenopathy. Does not bruise/bleed  easily.  Psychiatric/Behavioral: Negative for suicidal ideas, confusion, sleep disturbance, self-injury, dysphoric mood, decreased concentration and agitation.     Objective:    BP 100/70 (BP Location: Left Arm, Patient Position: Sitting, Cuff Size: Normal)    Pulse 78    Temp 97.9 F (36.6 C) (Oral)    Resp 16    Ht 5\' 2"  (1.575 m)    Wt 115 lb 9.6 oz (52.4 kg)    SpO2  98%    BMI 21.14 kg/m  General appearance: alert, cooperative, appears stated age, and no distress Head: Normocephalic, without obvious abnormality, atraumatic Eyes: conjunctivae/corneas clear. PERRL, EOM's intact. Fundi benign. Ears: normal TM's and external ear canals both ears Nose: Nares normal. Septum midline. Mucosa normal. No drainage or sinus tenderness. Throat: lips, mucosa, and tongue normal; teeth and gums normal Neck: no adenopathy, no carotid bruit, no JVD, supple, symmetrical, trachea midline, and thyroid not enlarged, symmetric, no tenderness/mass/nodules Back: symmetric, no curvature. ROM normal. No CVA tenderness. Lungs: clear to auscultation bilaterally Heart: regular rate and rhythm, S1, S2 normal, no murmur, click, rub or gallop Abdomen: soft, non-tender; bowel sounds normal; no masses,  no organomegaly Extremities: extremities normal, atraumatic, no cyanosis or edema Pulses: 2+ and symmetric Skin: Skin color, texture, turgor normal. No rashes or lesions Lymph nodes: Cervical, supraclavicular, and axillary nodes normal. Neurologic: Alert and oriented X 3, normal strength and tone. Normal symmetric reflexes. Normal coordination and gait    Assessment:    Healthy female exam.      Plan:    Ghm utd Check labs  See After Visit Summary for Counseling Recommendations   1. Preventative health care See above - CBC with Differential/Platelet - Comprehensive metabolic panel - Lipid panel - TSH - Hepatitis C antibody  2. Need for shingles vaccine   - Varicella-zoster vaccine IM  3. Hypothyroidism, unspecified type Con't synthroid Check labs today

## 2022-01-10 NOTE — Assessment & Plan Note (Signed)
Stable Check labs  Cont synthroid

## 2022-01-10 NOTE — Patient Instructions (Addendum)
Preventive Care 35-53 Years Old, Female Preventive care refers to lifestyle choices and visits with your health care provider that can promote health and wellness. Preventive care visits are also called wellness exams. What can I expect for my preventive care visit? Counseling Your health care provider may ask you questions about your: Medical history, including: Past medical problems. Family medical history. Pregnancy history. Current health, including: Menstrual cycle. Method of birth control. Emotional well-being. Home life and relationship well-being. Sexual activity and sexual health. Lifestyle, including: Alcohol, nicotine or tobacco, and drug use. Access to firearms. Diet, exercise, and sleep habits. Work and work Statistician. Sunscreen use. Safety issues such as seatbelt and bike helmet use. Physical exam Your health care provider will check your: Height and weight. These may be used to calculate your BMI (body mass index). BMI is a measurement that tells if you are at a healthy weight. Waist circumference. This measures the distance around your waistline. This measurement also tells if you are at a healthy weight and may help predict your risk of certain diseases, such as type 2 diabetes and high blood pressure. Heart rate and blood pressure. Body temperature. Skin for abnormal spots. What immunizations do I need? Vaccines are usually given at various ages, according to a schedule. Your health care provider will recommend vaccines for you based on your age, medical history, and lifestyle or other factors, such as travel or where you work. What tests do I need? Screening Your health care provider may recommend screening tests for certain conditions. This may include: Lipid and cholesterol levels. Diabetes screening. This is done by checking your blood sugar (glucose) after you have not eaten for a while (fasting). Pelvic exam and Pap test. Hepatitis B test. Hepatitis C  test. HIV (human immunodeficiency virus) test. STI (sexually transmitted infection) testing, if you are at risk. Lung cancer screening. Colorectal cancer screening. Mammogram. Talk with your health care provider about when you should start having regular mammograms. This may depend on whether you have a family history of breast cancer. BRCA-related cancer screening. This may be done if you have a family history of breast, ovarian, tubal, or peritoneal cancers. Bone density scan. This is done to screen for osteoporosis. Talk with your health care provider about your test results, treatment options, and if necessary, the need for more tests. Follow these instructions at home: Eating and drinking  Eat a diet that includes fresh fruits and vegetables, whole grains, lean protein, and low-fat dairy products. Take vitamin and mineral supplements as recommended by your health care provider. Do not drink alcohol if: Your health care provider tells you not to drink. You are pregnant, may be pregnant, or are planning to become pregnant. If you drink alcohol: Limit how much you have to 0-1 drink a day. Know how much alcohol is in your drink. In the [place] one drink equals one 12 oz bottle of beer (355 mL), ne 5 oz glass of wine (148 mL), or one 1 oz glass of hard liquor (44 mL). Lifestyle Brush your teeth every morning and night with fluoride toothpaste. Floss one time each day. Exercise for at least 30 minutes 5 or more days each week. Do not use any products that contain nicotine or tobacco. These products include cigarettes, chewing tobacco, and vaping devices, such as e-cigarettes. If you need help quitting, ask your health care provider. Do not use drugs. If you are sexually active, practice safe sex. Use a condom or other form of protection to prevent  STIs. °If you do not wish to become pregnant, use a form of birth control. If you plan to become pregnant, see your health care provider for a  prepregnancy visit. °Take aspirin only as told by your health care provider. Make sure that you understand how much to take and what form to take. Work with your health care provider to find out whether it is safe and beneficial for you to take aspirin daily. °Find healthy ways to manage stress, such as: °Meditation, yoga, or listening to music. °Journaling. °Talking to a trusted person. °Spending time with friends and family. °Minimize exposure to UV radiation to reduce your risk of skin cancer. °Safety °Always wear your seat belt while driving or riding in a vehicle. °Do not drive: °If you have been drinking alcohol. Do not ride with someone who has been drinking. °When you are tired or distracted. °While texting. °If you have been using any mind-altering substances or drugs. °Wear a helmet and other protective equipment during sports activities. °If you have firearms in your house, make sure you follow all gun safety procedures. °Seek help if you have been physically or sexually abused. °What's next? °Visit your health care provider once a year for an annual wellness visit. °Ask your health care provider how often you should have your eyes and teeth checked. °Stay up to date on all vaccines. °This information is not intended to replace advice given to you by your health care provider. Make sure you discuss any questions you have with your health care provider. °Document Revised: 06/02/2021 Document Reviewed: 06/02/2021 °Elsevier Patient Education © 2022 [employer] ° °

## 2022-01-28 ENCOUNTER — Encounter: Payer: Self-pay | Admitting: Family Medicine

## 2022-02-02 NOTE — Telephone Encounter (Signed)
Stay on current dose

## 2022-02-03 MED ORDER — LEVOTHYROXINE SODIUM 25 MCG PO TABS: 25.0000 ug | ORAL_TABLET | Freq: Every day | ORAL | 1 refills | Status: DC

## 2022-02-18 ENCOUNTER — Other Ambulatory Visit: Payer: Self-pay | Admitting: Family Medicine

## 2022-02-18 ENCOUNTER — Encounter: Payer: Self-pay | Admitting: Family Medicine

## 2022-02-18 MED ORDER — NONFORMULARY OR COMPOUNDED ITEM: 3 refills | Status: DC

## 2022-02-23 IMAGING — MG MM DIGITAL SCREENING BILAT W/ TOMO AND CAD
8 series · 9 of 24 positions shown · non-contrast
Comparison: Previous exam(s).

CLINICAL DATA: Screening.

EXAM:
DIGITAL SCREENING BILATERAL MAMMOGRAM WITH TOMOSYNTHESIS AND CAD
TECHNIQUE: Bilateral screening digital craniocaudal and mediolateral oblique
mammograms were obtained. Bilateral screening digital breast
tomosynthesis was performed. The images were evaluated with
computer-aided detection.

[R MLO synth-2D]
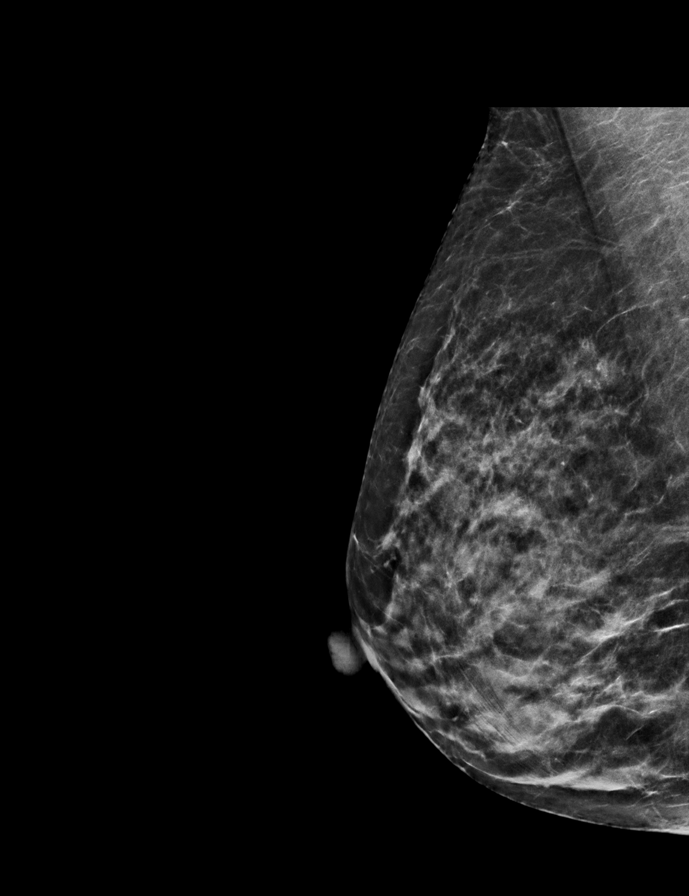

[R CC synth-2D]
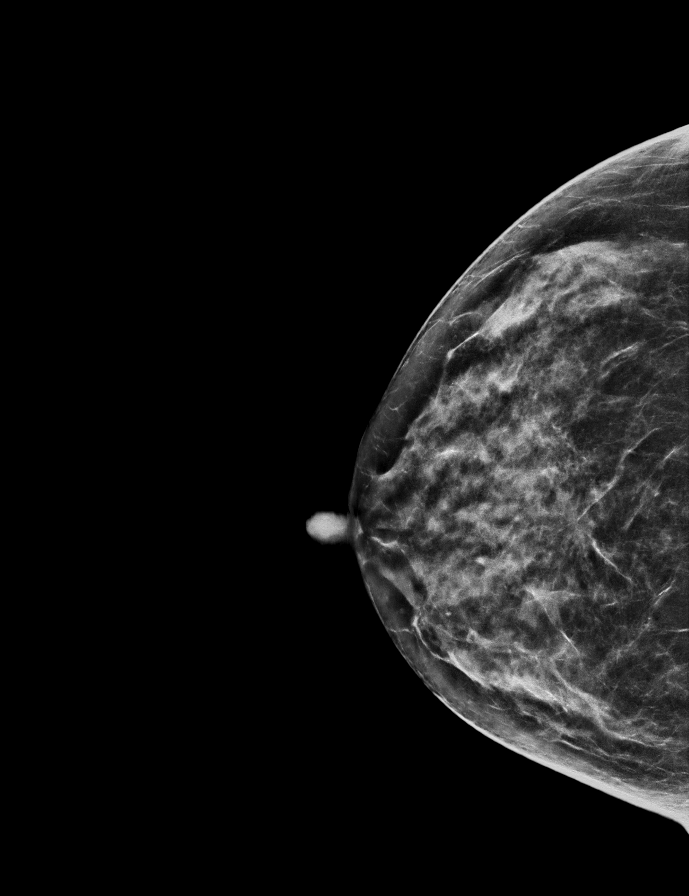

[L MLO synth-2D]
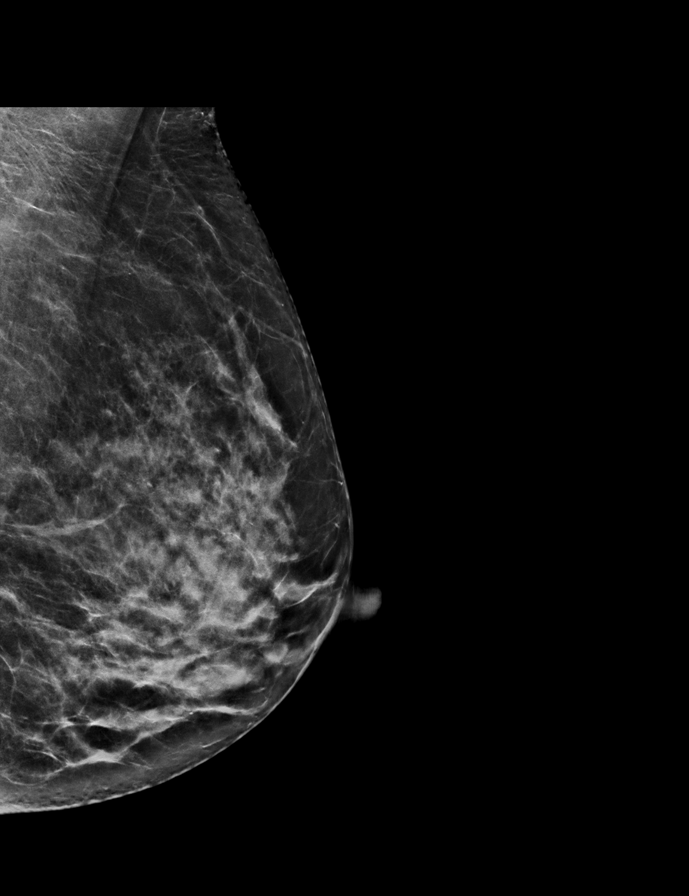

[L CC synth-2D]
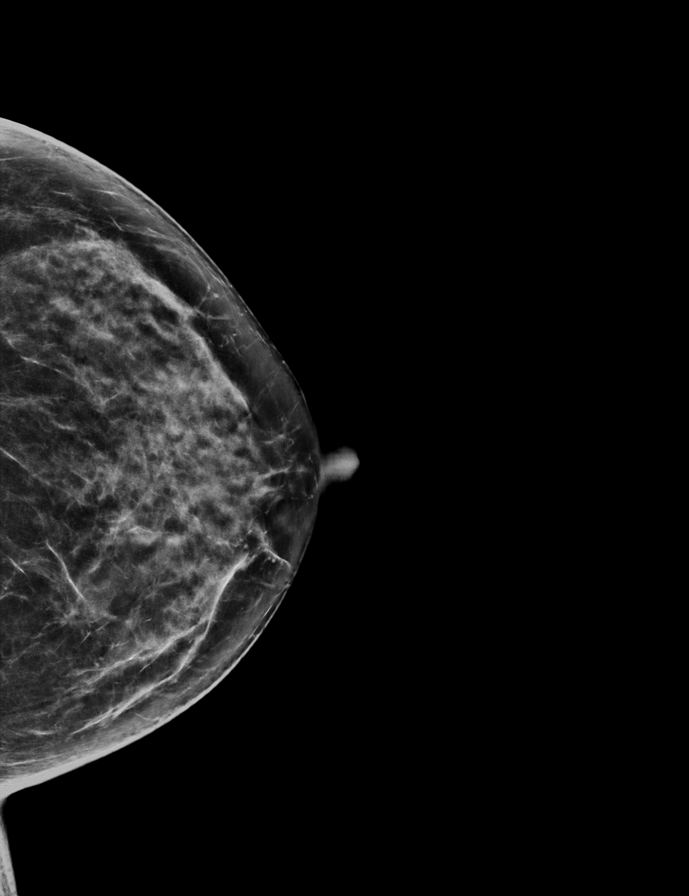

[R CC tomo · 2 of 59 frames shown]
[frame 20/59]
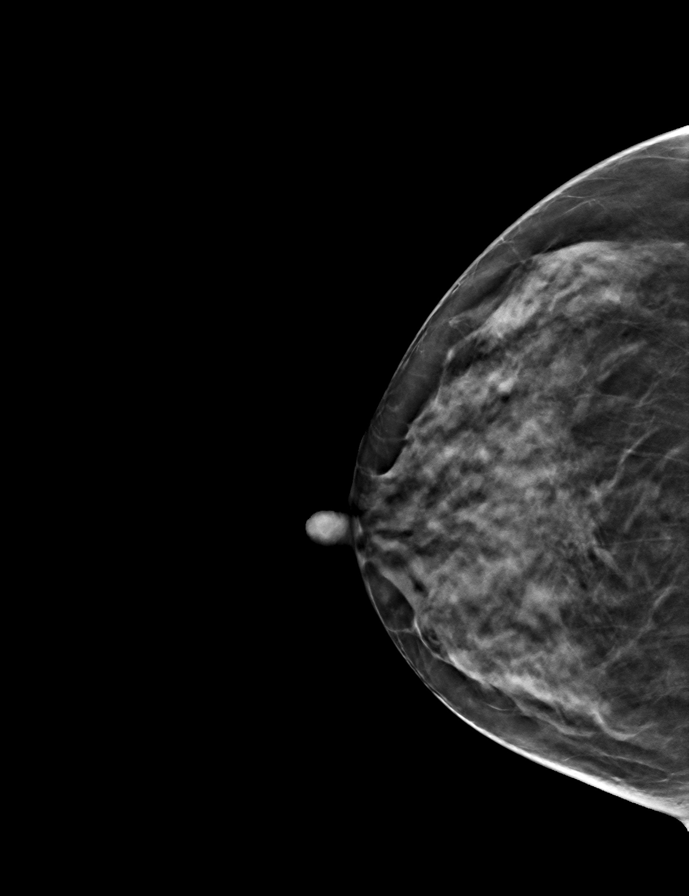
[frame 30/59]
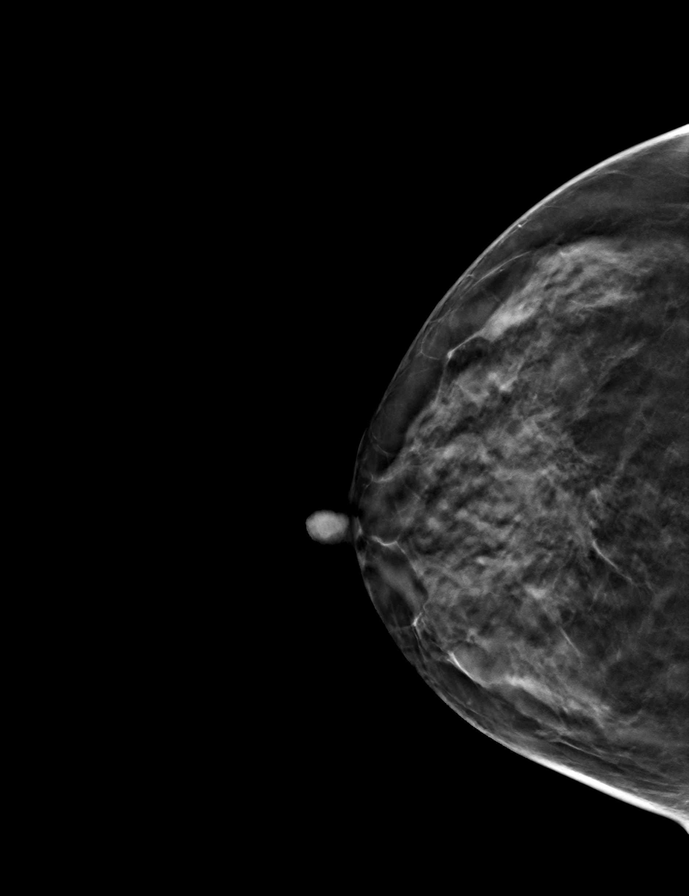

[L CC tomo · tomo slice 31/61.0]
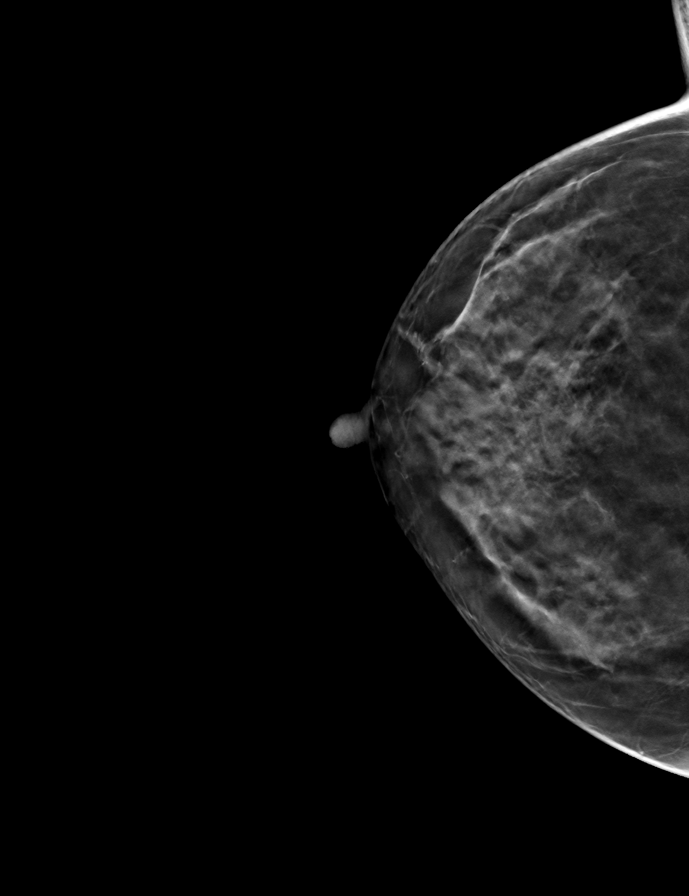

[L MLO tomo · tomo slice 31/60.0]
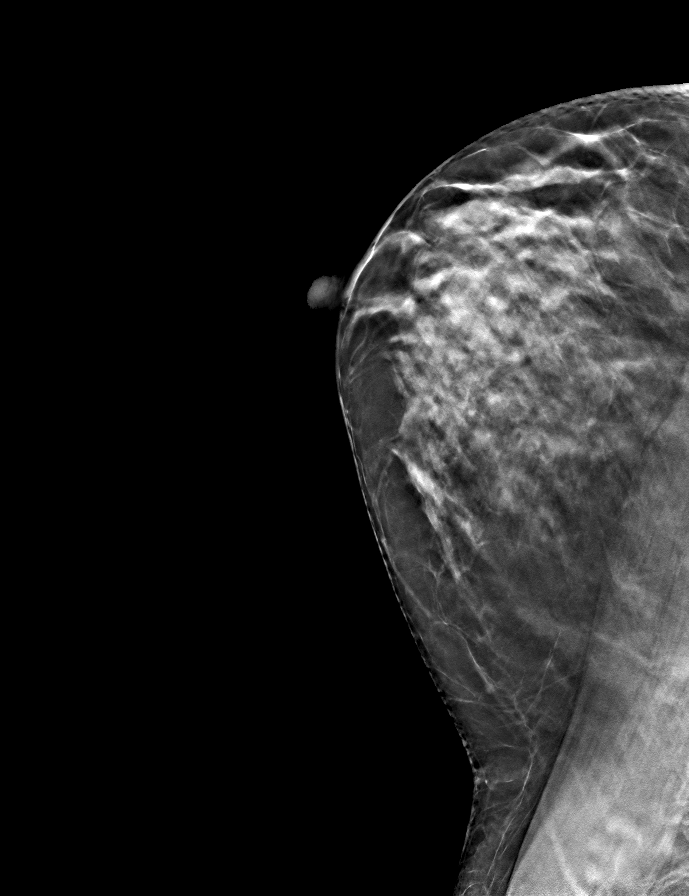

[R MLO tomo · tomo slice 29/56.0]
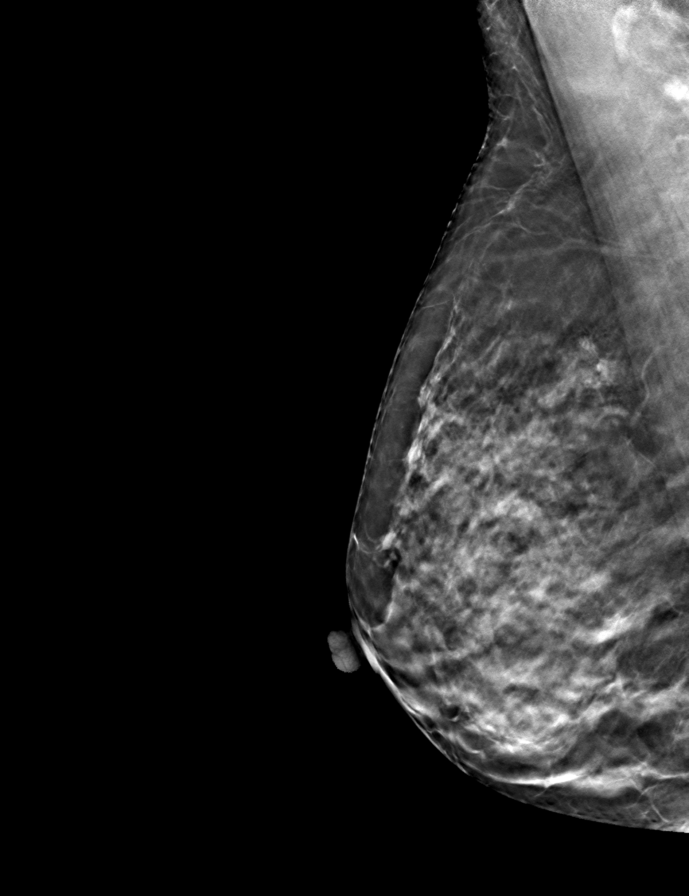

[9 of 24 positions shown; findings below may reference images not displayed]

ACR Breast Density Category c: The breast tissue is heterogeneously
dense, which may obscure small masses.
FINDINGS: There are no findings suspicious for malignancy.
IMPRESSION: No mammographic evidence of malignancy. A result letter of this
screening mammogram will be mailed directly to the patient.

RECOMMENDATION:
Screening mammogram in one year. (Code:Q3-W-BC3)

BI-RADS CATEGORY  1: Negative.

## 2022-02-24 ENCOUNTER — Encounter: Payer: Self-pay | Admitting: Family Medicine

## 2022-02-24 MED ORDER — NONFORMULARY OR COMPOUNDED ITEM: 3 refills | Status: DC

## 2022-02-24 NOTE — Telephone Encounter (Signed)
Responded to other mychart.

## 2022-02-24 NOTE — Telephone Encounter (Signed)
Pt called for update on prescription request  ? ?Please advise  ?

## 2022-06-02 ENCOUNTER — Telehealth: Payer: Self-pay

## 2022-06-02 NOTE — Telephone Encounter (Signed)
Appt scheduled

## 2022-06-02 NOTE — Telephone Encounter (Signed)
Nurse Assessment Nurse: Elesa Hacker, RN, [name] Date/Time [name] Time): 06/02/2022 1:12:34 PM Confirm and document reason for call. If symptomatic, describe symptoms. ---Caller states her throat has extra tissue. She denies soreness or pain. She is taking thyroid pills. Caller states that she has a lump on her throat. Noticed sx a few days ago. No temp. Does the patient have any new or worsening symptoms? ---Yes Will a triage be completed? ---Yes Related visit to physician within the last 2 weeks? ---No Does the PT have any chronic conditions? (i.e. diabetes, asthma, this includes High risk factors for pregnancy, etc.) ---Yes List chronic conditions. ---hypothyroidism Is the patient pregnant or possibly pregnant? (Ask all females between the ages of 61-55) ---No Is this a behavioral health or substance abuse call? ---No Guidelines Guideline Title Affirmed Question Affirmed Notes Nurse Date/Time (Eastern Time) Skin Lump or Localized Swelling [1] Small swelling or lump AND [2] unexplained AND [3] present > 1 week Deaton, RN, [name] 06/02/2022 1:14:49 PM Disp. Time [name] Time) Disposition Final User PLEASE NOTE: All timestamps contained within this report are represented as [place] Standard Time. CONFIDENTIALTY NOTICE: This fax transmission is intended only for the addressee. It contains information that is legally privileged, confidential or otherwise protected from use or disclosure. If you are not the intended recipient, you are strictly prohibited from reviewing, disclosing, copying using or disseminating any of this information or taking any action in reliance on or regarding this information. If you have received this fax in error, please notify us immediately by telephone so that we can arrange for its return to [place]. Phone: [phone], Toll-Free: [phone], Fax: [phone] Page: 2 of 2 Call Id: [id number] 06/02/2022 1:17:25 PM SEE PCP WITHIN 3 DAYS Yes Deaton, RN,  [name] Disagree/Comply Comply Caller Understands Yes PreDisposition Did not know what to do Care Advice Given Per Guideline SEE PCP WITHIN 3 DAYS: * You need to be seen within 2 or 3 days. CALL BACK IF: * Fever occurs * Swelling becomes painful * You become worse CARE ADVICE given per Skin Swelling or Lump (Adult) guideline. Comments User: Wandra Scot, RN Date/Time [name] Time): 06/02/2022 1:17:53 PM Caller transferred to the office to schedule an appt. Referrals REFERRED TO PCP OFFICE

## 2022-06-03 ENCOUNTER — Encounter: Payer: Self-pay | Admitting: Family Medicine

## 2022-06-03 ENCOUNTER — Ambulatory Visit: Payer: Managed Care, Other (non HMO) | Admitting: Family Medicine

## 2022-06-03 VITALS — BP 100/70 | HR 70 | Temp 98.3°F | Resp 18 | Ht 62.0 in | Wt 115.8 lb

## 2022-06-03 DIAGNOSIS — E039 Hypothyroidism, unspecified: Secondary | ICD-10-CM

## 2022-06-03 DIAGNOSIS — E049 Nontoxic goiter, unspecified: Secondary | ICD-10-CM | POA: Diagnosis not present

## 2022-06-03 LAB — CBC WITH DIFFERENTIAL/PLATELET
Basophils Absolute: 0 10*3/uL (ref 0.0–0.1)
Basophils Relative: 0.8 % (ref 0.0–3.0)
Eosinophils Absolute: 0.1 10*3/uL (ref 0.0–0.7)
Eosinophils Relative: 1.2 % (ref 0.0–5.0)
HCT: 39.9 % (ref 36.0–46.0)
Hemoglobin: 13.5 g/dL (ref 12.0–15.0)
Lymphocytes Relative: 36.6 % (ref 12.0–46.0)
Lymphs Abs: 1.6 10*3/uL (ref 0.7–4.0)
MCHC: 33.8 g/dL (ref 30.0–36.0)
MCV: 87.9 fl (ref 78.0–100.0)
Monocytes Absolute: 0.2 10*3/uL (ref 0.1–1.0)
Monocytes Relative: 5.6 % (ref 3.0–12.0)
Neutro Abs: 2.5 10*3/uL (ref 1.4–7.7)
Neutrophils Relative %: 55.8 % (ref 43.0–77.0)
Platelets: 210 10*3/uL (ref 150.0–400.0)
RBC: 4.54 Mil/uL (ref 3.87–5.11)
RDW: 12.6 % (ref 11.5–15.5)
WBC: 4.4 10*3/uL (ref 4.0–10.5)

## 2022-06-03 NOTE — Assessment & Plan Note (Signed)
Check US thyroid Check labs 

## 2022-06-03 NOTE — Progress Notes (Signed)
Established Patient Office Visit  Subjective   Patient ID: Kirsten Ramirez, female    DOB: August 23, 1971  Age: 51 y.o. MRN: [id number]  Chief Complaint  Patient presents with   Hypothyroidism   Follow-up    HPI  Patient Active Problem List   Diagnosis Date Noted   Enlarged thyroid 06/03/2022   infected abscess 09/10/2019   Preventative health care 06/02/2018   Pelvic pain in female 09/25/2015   Wellness examination 01/02/2015   Hypothyroidism 12/29/2014   Urticaria 12/29/2014   Past Medical History:  Diagnosis Date   Allergy    Thyroid disease    Past Surgical History:  Procedure Laterality Date   APPENDECTOMY  1992   CESAREAN SECTION  2009, 2011   TUBAL LIGATION     Social History   Tobacco Use   Smoking status: Never   Smokeless tobacco: Never  Substance Use Topics   Alcohol use: Yes    Alcohol/week: 0.0 standard drinks of alcohol    Comment: only very rare and very little around holidays.   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   Drug use: No   Sexual activity: Yes    Partners: Male  Other Topics Concern   Not on file  Social History Narrative   Exercise-- walks in am   Social Determinants of Health   Financial Resource Strain: Not on file  Food Insecurity: Not on file  Transportation Needs: Not on file  Physical Activity: Not on file  Stress: Not on file  Social Connections: Not on file  Intimate Partner Violence: Not on file   Family Status  Relation Name Status   Mother  Alive   Father  Alive    Mat Aunt  Deceased at age 30   Mat Uncle  Deceased at age 16   Neg Hx  (Not Specified)   Family History  Problem Relation Age of Onset   Hypertension Mother    Hypertension Father    Cancer Father        colon, liver   Stroke Maternal Aunt    Colon cancer Neg Hx    Esophageal cancer Neg Hx    Rectal cancer Neg Hx    Stomach cancer Neg Hx    No Known Allergies    Review of Systems  Constitutional:  Negative for fever and malaise/fatigue.  HENT:  Negative for congestion.   Eyes:  Negative for blurred vision.  Respiratory:  Negative for cough and shortness of breath.   Cardiovascular:  Negative for chest pain, palpitations and leg swelling.  Gastrointestinal:  Negative for abdominal pain, blood in stool, nausea and vomiting.  Genitourinary:  Negative for dysuria and frequency.  Musculoskeletal:  Negative for back pain and falls.  Skin:  Negative for rash.  Neurological:  Negative for dizziness, loss of consciousness and headaches.  Endo/Heme/Allergies:  Negative for environmental allergies.  Psychiatric/Behavioral:  Negative for depression. The patient is not nervous/anxious.  Objective:     BP 100/70 (BP Location: Left Arm, Patient Position: Sitting, Cuff Size: Normal)   Pulse 70   Temp 98.3 F (36.8 C) (Oral)   Resp 18   Ht 5\' 2"  (1.575 m)   Wt 115 lb 12.8 oz (52.5 kg)   SpO2 98%   BMI 21.18 kg/m  BP Readings from Last 3 Encounters:  06/03/22 100/70  01/10/22 100/70  07/13/21 117/71   Wt Readings from Last 3 Encounters:  06/03/22 115 lb 12.8 oz (52.5 kg)  01/10/22 115 lb 9.6 oz (52.4 kg)  07/13/21 119 lb (54 kg)   SpO2 Readings from Last 3 Encounters:  06/03/22 98%  01/10/22 98%  07/13/21 99%      Physical Exam Vitals and nursing note reviewed.  Constitutional:      Appearance: She is well-developed.  HENT:     Head: Normocephalic and atraumatic.  Eyes:     Conjunctiva/sclera: Conjunctivae normal.  Neck:     Thyroid: No thyromegaly.      Vascular: No carotid bruit or JVD.  Cardiovascular:     Rate and Rhythm: Normal rate and regular rhythm.     Heart sounds: Normal heart sounds. No murmur heard. Pulmonary:     Effort: Pulmonary effort is normal. No respiratory distress.     Breath sounds: Normal breath sounds. No wheezing or rales.  Chest:     Chest wall: No tenderness.  Musculoskeletal:     Cervical back: Normal range of motion and neck supple.  Neurological:     General: No focal deficit present.     Mental Status: She is alert and oriented to person, place, and time.  Psychiatric:        Mood and Affect: Mood normal.        Thought Content: Thought content normal.        Judgment: Judgment normal.      Results for orders placed or performed in visit on 06/03/22  CBC with Differential/Platelet  Result Value Ref Range   WBC 4.4 4.0 - 10.5 K/uL   RBC 4.54 3.87 - 5.11 Mil/uL   Hemoglobin 13.5 12.0 - 15.0 g/dL   HCT 06/05/22 52.8 - 41.3 %   MCV 87.9 78.0 - 100.0 fl   MCHC 33.8 30.0 - 36.0 g/dL   RDW 24.4 01.0 - 27.2 %   Platelets 210.0 150.0 - 400.0 K/uL   Neutrophils Relative % 55.8 43.0 - 77.0 %   Lymphocytes Relative 36.6 12.0 - 46.0 %   Monocytes Relative 5.6 3.0 - 12.0 %   Eosinophils Relative 1.2 0.0 - 5.0 %   Basophils Relative 0.8 0.0 - 3.0 %   Neutro Abs 2.5 1.4 - 7.7 K/uL   Lymphs Abs 1.6 0.7 - 4.0 K/uL   Monocytes Absolute 0.2 0.1 - 1.0 K/uL   Eosinophils Absolute 0.1 0.0 - 0.7 K/uL   Basophils Absolute 0.0 0.0 - 0.1 K/uL    Last CBC Lab Results  Component Value Date   WBC 4.4 06/03/2022   HGB 13.5 06/03/2022   HCT 39.9 06/03/2022   MCV 87.9 06/03/2022   RDW 12.6 06/03/2022   PLT 210.0 06/03/2022   Last metabolic panel Lab Results  Component Value Date   GLUCOSE 88 01/10/2022   NA 141 01/10/2022   K 4.1 01/10/2022   CL 105 01/10/2022   CO2 28 01/10/2022   BUN 16 01/10/2022   CREATININE 0.72 01/10/2022   CALCIUM 9.5 01/10/2022   PROT 7.0 01/10/2022  ALBUMIN 4.7 01/10/2022    BILITOT 1.0 01/10/2022   ALKPHOS 37 (L) 01/10/2022   AST 13 01/10/2022   ALT 12 01/10/2022   Last lipids Lab Results  Component Value Date   CHOL 186 01/10/2022   HDL 54.20 01/10/2022   LDLCALC 92 01/10/2022   TRIG 199.0 (H) 01/10/2022   CHOLHDL 3 01/10/2022   Last hemoglobin A1c No results found for: "HGBA1C" Last thyroid functions Lab Results  Component Value Date   TSH 1.91 01/10/2022   Last vitamin D Lab Results  Component Value Date   VD25OH 26.75 (L) 03/04/2020   Last vitamin B12 and Folate Lab Results  Component Value Date   VITAMINB12 >1500 (H) 03/04/2020      The 10-year ASCVD risk score (Arnett DK, et al., 2019) is: 0.9%    Assessment & Plan:   Problem List Items Addressed This Visit       Unprioritized   Hypothyroidism - Primary    Check labs       Relevant Orders   Thyroid Panel With TSH   CBC with Differential/Platelet (Completed)   US SOFT TISSUE HEAD & NECK (NON-THYROID)   Enlarged thyroid    Check US thyroid  Check labs       Relevant Orders   Thyroid Panel With TSH   CBC with Differential/Platelet (Completed)   US SOFT TISSUE HEAD & NECK (NON-THYROID)    Return if symptoms worsen or fail to improve.    Donato Schultz, DO

## 2022-06-03 NOTE — Patient Instructions (Signed)
Thyroid-Stimulating Hormone Test Why am I having this test? The thyroid is a gland in the lower front of the neck. It makes hormones that affect many body parts and systems, including the system that affects how quickly the body burns fuel for energy (metabolism). The pituitary gland is located just below the brain, behind the eyes and nasal passages. It helps maintain thyroid hormone levels and thyroid gland function. You may have a thyroid-stimulating hormone (TSH) test if you have possible symptoms of abnormal thyroid hormone levels. This test can help your health care provider: Diagnose a disorder of the thyroid gland or pituitary gland. Manage your condition and treatment if you have an underactive thyroid (hypothyroidism) or an overactive thyroid (hyperthyroidism). Newborn babies may have this test done to screen for hypothyroidism that is present at birth (congenital). What is being tested? This test measures the amount of TSH in your blood. TSH may also be called thyrotropin. When the thyroid does not make enough hormones, the pituitary gland releases TSH into the bloodstream to stimulate the thyroid gland to make more hormones. What kind of sample is taken?     A blood sample is required for this test. It is usually collected by inserting a needle into a blood vessel. For newborns, a small amount of blood may be collected from the umbilical cord, or by using a small needle to prick the baby's heel (heel stick). Tell a health care provider about: All medicines you are taking, including vitamins, herbs, eye drops, creams, and over-the-counter medicines. Any bleeding problems you have. Any surgeries you have had. Any medical conditions you have. Whether you are pregnant or may be pregnant. How are the results reported? Your test results will be reported as a value that indicates how much TSH is in your blood. Your health care provider will compare your results to normal ranges that were  established after testing a large group of people (reference ranges). Reference ranges may vary among labs and hospitals. For this test, common reference ranges are: Adult: 2-10 microunits/mL or 2-10 milliunits/L. Newborn: Heel stick: 3-18 microunits/mL or 3-18 milliunits/L. Umbilical cord: 3-12 microunits/mL or 3-12 milliunits/L. What do the results mean? Results that are within the reference range are considered normal. This means that you have a normal amount of TSH in your blood. Results that are higher than the reference range mean that your TSH levels are too high. This may mean: Your thyroid gland is not making enough thyroid hormones. Your thyroid medicine dosage is too low. You have a tumor on your pituitary gland. This is rare. Results that are lower than the reference range mean that your TSH levels are too low. This may be caused by hyperthyroidism or by a problem with the pituitary gland function. Talk with your health care provider about what your results mean. Questions to ask your health care provider Ask your health care provider, or the department that is doing the test: When will my results be ready? How will I get my results? What are my treatment options? What other tests do I need? What are my next steps? Summary You may have a thyroid-stimulating hormone (TSH) test if you have possible symptoms of abnormal thyroid hormone levels. The thyroid is a gland in the lower front of the neck. It makes hormones that affect many body parts and systems. The pituitary gland is located just below the brain, behind the eyes and nasal passages. It helps maintain thyroid hormone levels and thyroid gland function. This test   measures the amount of TSH in your blood. TSH is made by the pituitary gland. It may also be called thyrotropin. This information is not intended to replace advice given to you by your health care provider. Make sure you discuss any questions you have with your  health care provider. Document Revised: 12/07/2021 Document Reviewed: 12/07/2021 Elsevier Patient Education  2023 [employer]  

## 2022-06-03 NOTE — Assessment & Plan Note (Signed)
Check labs 

## 2022-06-04 LAB — THYROID PANEL WITH TSH
Free Thyroxine Index: 2 (ref 1.4–3.8)
T3 Uptake: 35 % (ref 22–35)
T4, Total: 5.8 ug/dL (ref 5.1–11.9)
TSH: 1.34 mIU/L

## 2022-06-06 ENCOUNTER — Ambulatory Visit (HOSPITAL_BASED_OUTPATIENT_CLINIC_OR_DEPARTMENT_OTHER)
Admission: RE | Admit: 2022-06-06 | Discharge: 2022-06-06 | Disposition: A | Discharge status: Home / Self Care | Payer: Managed Care, Other (non HMO) | Source: Ambulatory Visit | Attending: Family Medicine | Admitting: Family Medicine

## 2022-06-06 DIAGNOSIS — E049 Nontoxic goiter, unspecified: Secondary | ICD-10-CM | POA: Insufficient documentation

## 2022-06-06 DIAGNOSIS — E039 Hypothyroidism, unspecified: Secondary | ICD-10-CM | POA: Diagnosis present

## 2022-07-20 IMAGING — US US SOFT TISSUE HEAD/NECK
2 series · 14 of 25 positions shown · non-contrast
Comparison: None available

CLINICAL DATA: Hypothyroidism

Lump in throat
EXAM:
THYROID ULTRASOUND
TECHNIQUE: Ultrasound examination of the thyroid gland and adjacent soft
tissues was performed.

[Series 1: us soft tissue head/neck · 13 of 36 slices shown (1 of 2)]
[im 1/36]
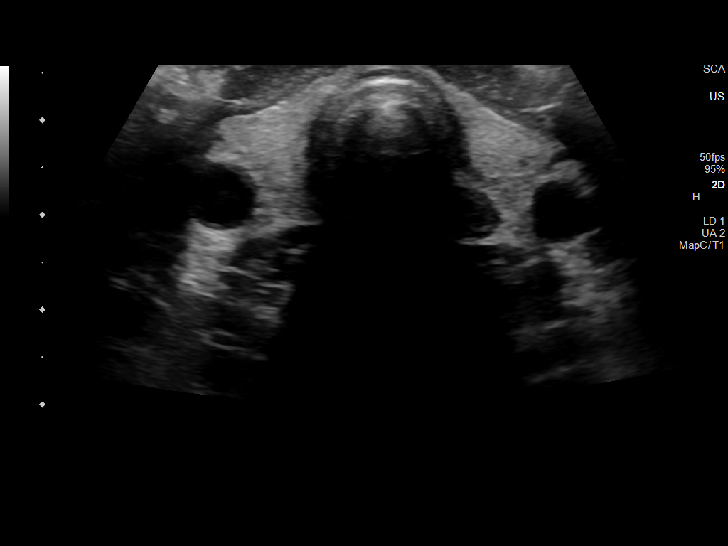
[im 4/36]
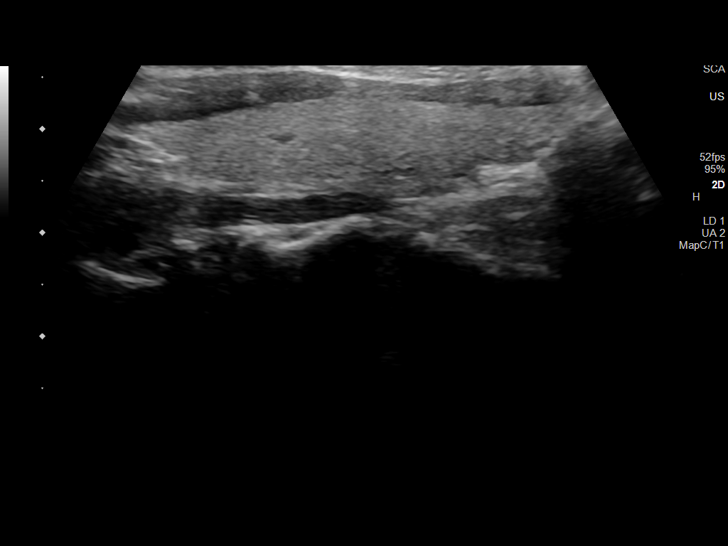
[im 7/36]
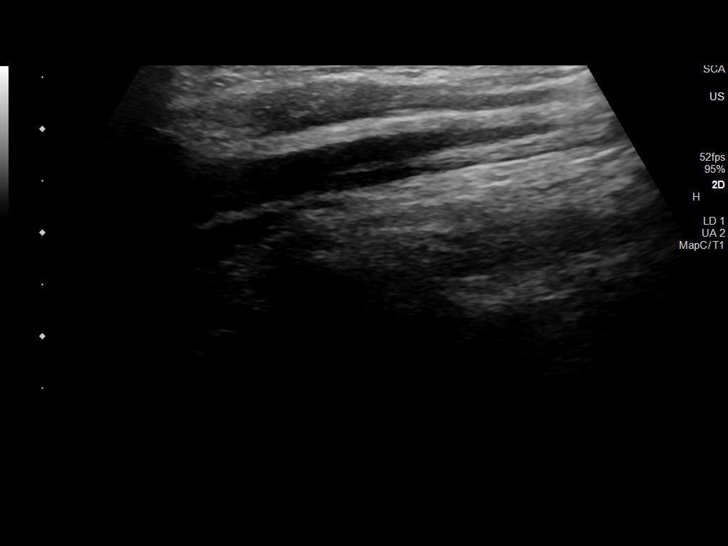
[im 10/36]
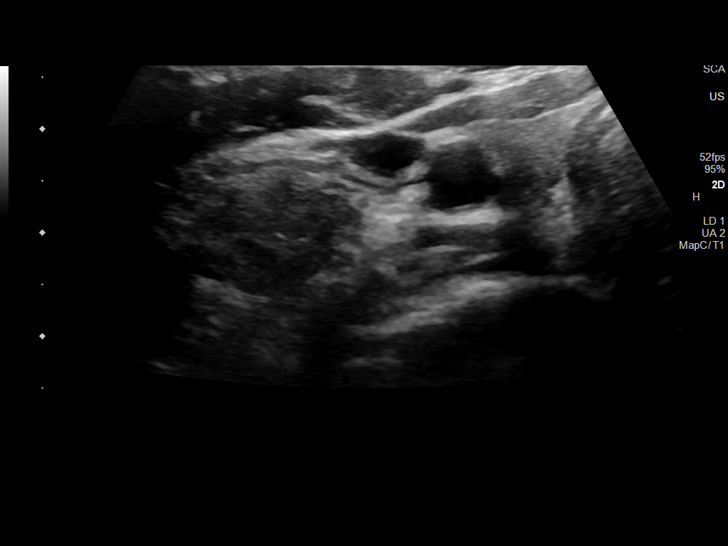
[im 13/36]
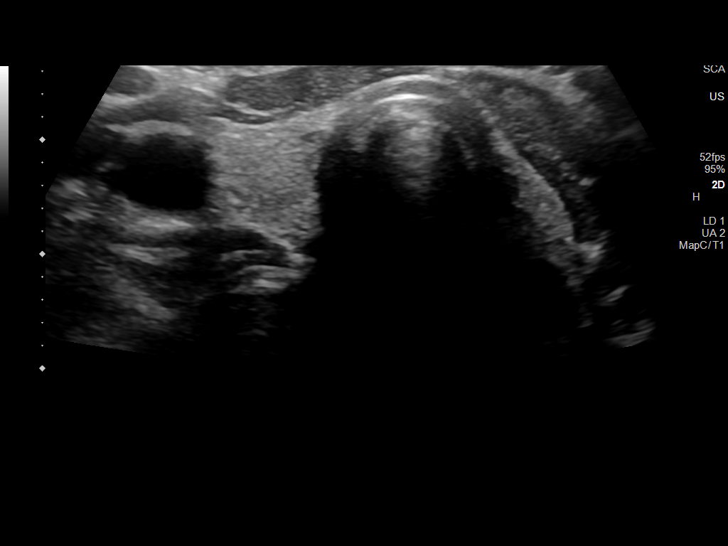
[im 14/36]
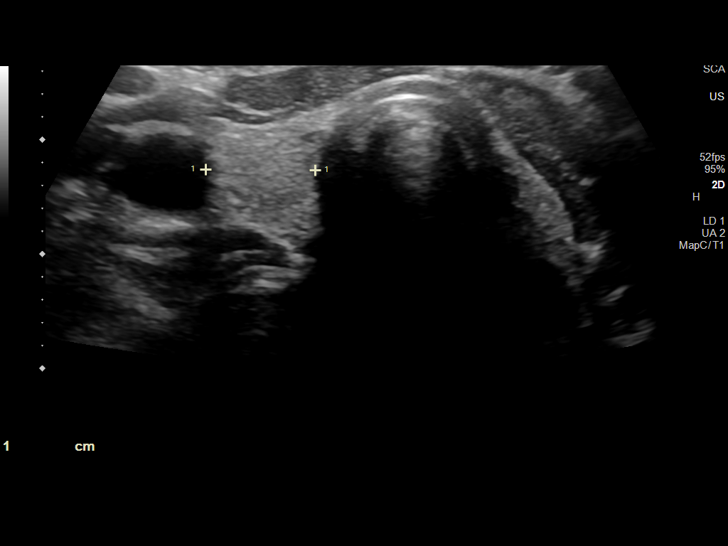
[im 17/36]
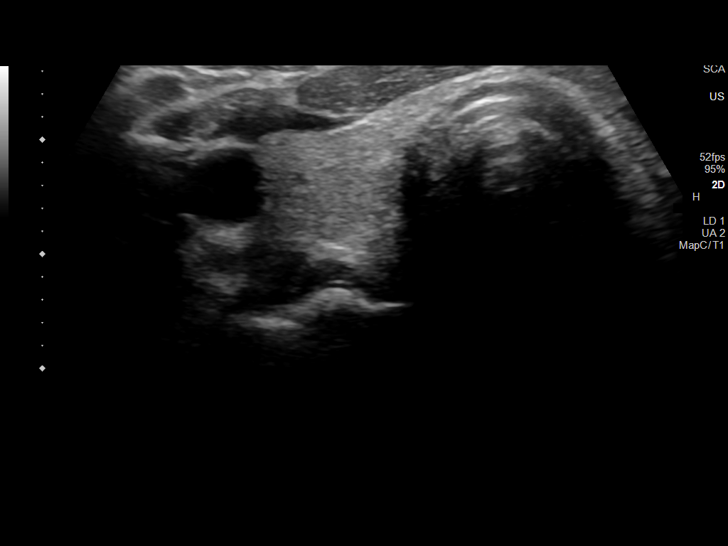
[im 20/36]
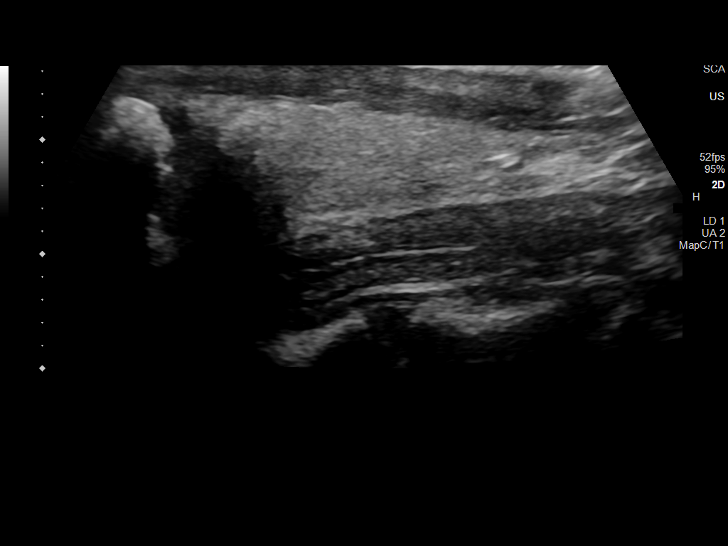
[im 23/36]
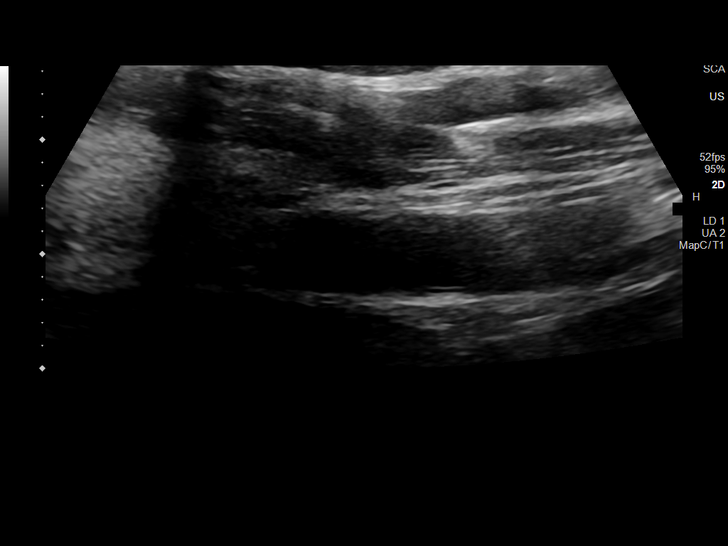
[im 25/36]
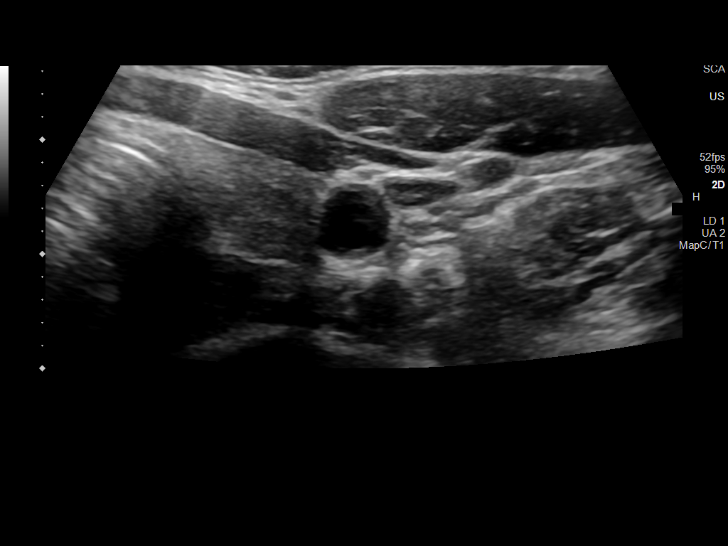
[im 28/36]
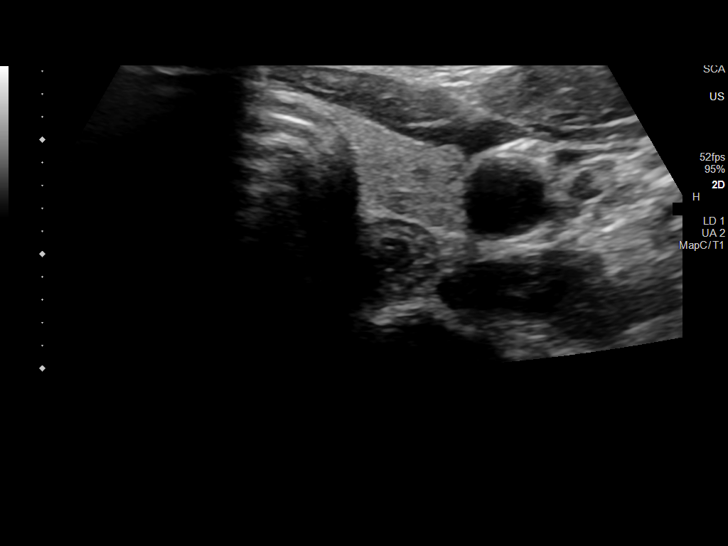
[im 31/36]
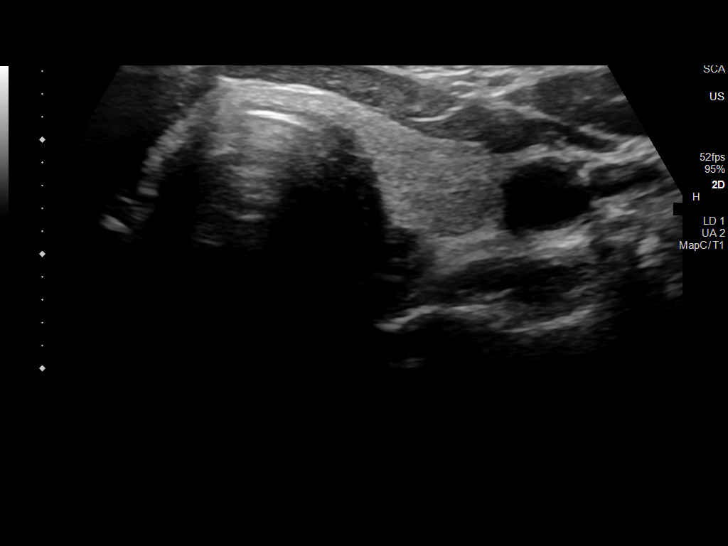
[im 34/36]
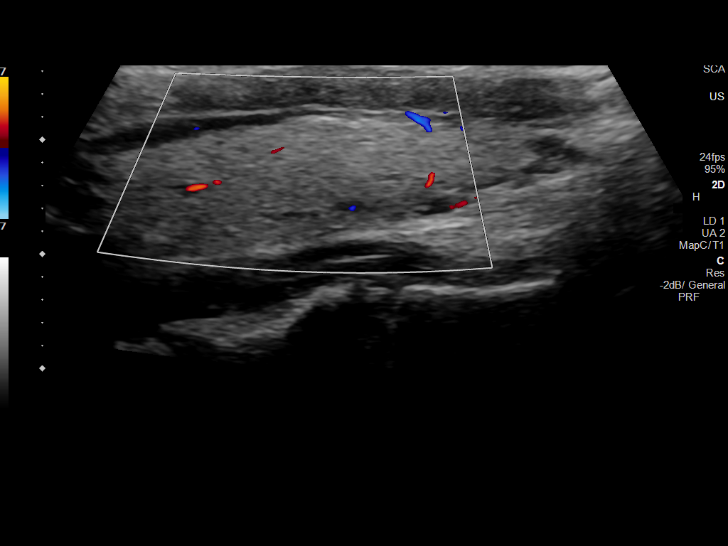

[Series 2: us soft tissue head/neck · 1 of 1 slices shown (2 of 2)]
[im 1/1]
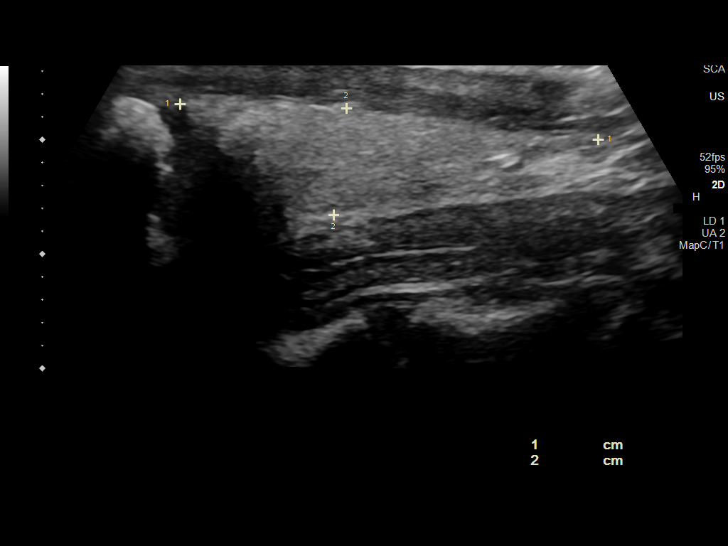

[14 of 25 positions shown; findings below may reference images not displayed]

FINDINGS: Parenchymal Echotexture: Normal

Isthmus: 0.2 cm

Right lobe: 4.3 x 1.0 x 1.0 cm

Left lobe: 3.7 x 0.9 x 1.2 cm

_________________________________________________________

Estimated total number of nodules >/= 1 cm: 0

Number of spongiform nodules >/=  2 cm not described below (TR1): 0

Number of mixed cystic and solid nodules >/= 1.5 cm not described
below (TR2): 0

_________________________________________________________

No discrete nodules are seen within the thyroid gland.
IMPRESSION: Normal thyroid ultrasound.

The above is in keeping with the ACR TI-RADS recommendations - [HOSPITAL] 7766;[DATE].

## 2022-08-09 ENCOUNTER — Ambulatory Visit: Payer: Managed Care, Other (non HMO) | Admitting: Family Medicine

## 2022-08-09 ENCOUNTER — Encounter: Payer: Self-pay | Admitting: Family Medicine

## 2022-08-09 VITALS — BP 100/61 | HR 71 | Temp 98.6°F | Ht 62.0 in | Wt 119.0 lb

## 2022-08-09 DIAGNOSIS — S46812A Strain of other muscles, fascia and tendons at shoulder and upper arm level, left arm, initial encounter: Secondary | ICD-10-CM | POA: Diagnosis not present

## 2022-08-09 MED ORDER — TIZANIDINE HCL 4 MG PO TABS: 4.0000 mg | ORAL_TABLET | Freq: Four times a day (QID) | ORAL | 0 refills | Status: DC | PRN

## 2022-08-09 NOTE — Progress Notes (Signed)
Musculoskeletal Exam  Patient: Kirsten Ramirez DOB: October 10, 1971  DOS: 08/09/2022  SUBJECTIVE:  Chief Complaint:   Chief Complaint  Patient presents with   Shoulder Pain    Left     Headache    Kirsten Ramirez is a 51 y.o.  female for evaluation and treatment of L shoulder/neck pain.   Onset:  11 days ago. No inj or change in activity.  Location: points to L trap Character:  sharp  Progression of issue:  has worsened Associated symptoms: causing headaches as well No bruising, redness, swelling Treatment: to date has been topical oils and Salon Pas.   Neurovascular symptoms: no  Past Medical History:  Diagnosis Date   Allergy    Thyroid disease     Objective: VITAL SIGNS: BP 100/61   Pulse 71   Temp 98.6 F (37 C) (Oral)   Ht 5\' 2"  (1.575 m)   Wt 119 lb (54 kg)   SpO2 98%   BMI 21.77 kg/m  Constitutional: Well formed, well developed. No acute distress. Thorax & Lungs: No accessory muscle use Musculoskeletal: L shoulder.   Normal active range of motion: yes.   Normal passive range of motion: yes Tenderness to palpation: yes over various portions of the trap Deformity: no Ecchymosis: no Tests positive: None Tests negative: Spurling's, cross over, Neer's, liftoff, O'Briens Neurologic: Normal sensory function. No focal deficits noted. DTR's equal and symmetric in LE's. No clonus.  Grip strength adequate Psychiatric: Normal mood. Age appropriate judgment and insight. Alert & oriented x 3.    Assessment:  Trapezius strain, left, initial encounter - Plan: tiZANidine (ZANAFLEX) 4 MG tablet  Plan: Stretches/exercises, heat, ice, Tylenol.  PT if no improvement next 3 to 4 weeks. She was also concerned about her triglycerides from her physical back in January.  She is unsure if she was fasting.  She will wait until her physical this January to recheck, I told her to be fasting leading into this appointment. F/u as originally scheduled with regular PCP.February The patient voiced  understanding and agreement to the plan.   Marland Kitchen Ackley, DO 08/09/22  12:21 PM

## 2022-08-09 NOTE — Patient Instructions (Addendum)
Heat (pad or rice pillow in microwave) over affected area, 10-15 minutes twice daily. Marland Kitchenn  Ice/cold pack over area for 10-15 min twice daily.  OK to take Tylenol 1000 mg (2 extra strength tabs) or 975 mg (3 regular strength tabs) every 6 hours as needed.  Send me a message in 3-4 weeks if no improvement, physical therapy is the next step.   Let us know if you need anything.  Trapezius stretches/exercises Do exercises exactly as told by your health care provider and adjust them as directed. It is normal to feel mild stretching, pulling, tightness, or discomfort as you do these exercises, but you should stop right away if you feel sudden pain or your pain gets worse.   Stretching and range of motion exercises These exercises warm up your muscles and joints and improve the movement and flexibility of your shoulder. These exercises can also help to relieve pain, numbness, and tingling. If you are unable to do any of the following for any reason, do not further attempt to do it.   Exercise A: Flexion, standing     Stand and hold a broomstick, a cane, or a similar object. Place your hands a little more than shoulder-width apart on the object. Your left / right hand should be palm-up, and your other hand should be palm-down. Push the stick to raise your left / right arm out to your side and then over your head. Use your other hand to help move the stick. Stop when you feel a stretch in your shoulder, or when you reach the angle that is recommended by your health care provider. Avoid shrugging your shoulder while you raise your arm. Keep your shoulder blade tucked down toward your spine. Hold for 30 seconds. Slowly return to the starting position. Repeat 2 times. Complete this exercise 3 times per week.  Exercise B: Abduction, supine     Lie on your back and hold a broomstick, a cane, or a similar object. Place your hands a little more than shoulder-width apart on the object. Your left / right  hand should be palm-up, and your other hand should be palm-down. Push the stick to raise your left / right arm out to your side and then over your head. Use your other hand to help move the stick. Stop when you feel a stretch in your shoulder, or when you reach the angle that is recommended by your health care provider. Avoid shrugging your shoulder while you raise your arm. Keep your shoulder blade tucked down toward your spine. Hold for 30 seconds. Slowly return to the starting position. Repeat 2 times. Complete this exercise 3 times per week.  Exercise C: Flexion, active-assisted     Lie on your back. You may bend your knees for comfort. Hold a broomstick, a cane, or a similar object. Place your hands about shoulder-width apart on the object. Your palms should face toward your feet. Raise the stick and move your arms over your head and behind your head, toward the floor. Use your healthy arm to help your left / right arm move farther. Stop when you feel a gentle stretch in your shoulder, or when you reach the angle where your health care provider tells you to stop. Hold for 30 seconds. Slowly return to the starting position. Repeat 2 times. Complete this exercise 3 times per week.  Exercise D: External rotation and abduction     Stand in a door frame with one of your feet slightly in  front of the other. This is called a staggered stance. Choose one of the following positions as told by your health care provider: Place your hands and forearms on the door frame above your head. Place your hands and forearms on the door frame at the height of your head. Place your hands on the door frame at the height of your elbows. Slowly move your weight onto your front foot until you feel a stretch across your chest and in the front of your shoulders. Keep your head and chest upright and keep your abdominal muscles tight. Hold for 30 seconds. To release the stretch, shift your weight to your back  foot. Repeat 2 times. Complete this stretch 3 times per week.  Strengthening exercises These exercises build strength and endurance in your shoulder. Endurance is the ability to use your muscles for a long time, even after your muscles get tired. Exercise E: Scapular depression and adduction  Sit on a stable chair. Support your arms in front of you with pillows, armrests, or a tabletop. Keep your elbows in line with the sides of your body. Gently move your shoulder blades down toward your middle back. Relax the muscles on the tops of your shoulders and in the back of your neck. Hold for 3 seconds. Slowly release the tension and relax your muscles completely before doing this exercise again. Repeat for a total of 10 repetitions. After you have practiced this exercise, try doing the exercise without the arm support. Then, try the exercise while standing instead of sitting. Repeat 2 times. Complete this exercise 3 times per week.  Exercise F: Shoulder abduction, isometric     Stand or sit about 4-6 inches (10-15 cm) from a wall with your left / right side facing the wall. Bend your left / right elbow and gently press your elbow against the wall. Increase the pressure slowly until you are pressing as hard as you can without shrugging your shoulder. Hold for 3 seconds. Slowly release the tension and relax your muscles completely. Repeat for a total of 10 repetitions. Repeat 2 times. Complete this exercise 3 times per week.  Exercise G: Shoulder flexion, isometric     Stand or sit about 4-6 inches (10-15 cm) away from a wall with your left / right side facing the wall. Keep your left / right elbow straight and gently press the top of your fist against the wall. Increase the pressure slowly until you are pressing as hard as you can without shrugging your shoulder. Hold for 10-15 seconds. Slowly release the tension and relax your muscles completely. Repeat for a total of 10 repetitions. Repeat  2 times. Complete this exercise 3 times per week.  Exercise H: Internal rotation     Sit in a stable chair without armrests, or stand. Secure an exercise band at your left / right side, at elbow height. Place a soft object, such as a folded towel or a small pillow, under your left / right upper arm so your elbow is a few inches (about 8 cm) away from your side. Hold the end of the exercise band so the band stretches. Keeping your elbow pressed against the soft object under your arm, move your forearm across your body toward your abdomen. Keep your body steady so the movement is only coming from your shoulder. Hold for 3 seconds. Slowly return to the starting position. Repeat for a total of 10 repetitions. Repeat 2 times. Complete this exercise 3 times per week.  Exercise  I: External rotation     Sit in a stable chair without armrests, or stand. Secure an exercise band at your left / right side, at elbow height. Place a soft object, such as a folded towel or a small pillow, under your left / right upper arm so your elbow is a few inches (about 8 cm) away from your side. Hold the end of the exercise band so the band stretches. Keeping your elbow pressed against the soft object under your arm, move your forearm out, away from your abdomen. Keep your body steady so the movement is only coming from your shoulder. Hold for 3 seconds. Slowly return to the starting position. Repeat for a total of 10 repetitions. Repeat 2 times. Complete this exercise 3 times per week. Exercise J: Shoulder extension  Sit in a stable chair without armrests, or stand. Secure an exercise band to a stable object in front of you so the band is at shoulder height. Hold one end of the exercise band in each hand. Your palms should face each other. Straighten your elbows and lift your hands up to shoulder height. Step back, away from the secured end of the exercise band, until the band stretches. Squeeze your shoulder  blades together and pull your hands down to the sides of your thighs. Stop when your hands are straight down by your sides. Do not let your hands go behind your body. Hold for 3 seconds. Slowly return to the starting position. Repeat for a total of 10 repetitions. Repeat 2 times. Complete this exercise 3 times per week.  Exercise K: Shoulder extension, prone     Lie on your abdomen on a firm surface so your left / right arm hangs over the edge. Hold a 5 lb weight in your hand so your palm faces in toward your body. Your arm should be straight. Squeeze your shoulder blade down toward the middle of your back. Slowly raise your arm behind you, up to the height of the surface that you are lying on. Keep your arm straight. Hold for 3 seconds. Slowly return to the starting position and relax your muscles. Repeat for a total of 10 repetitions. Repeat 2 times. Complete this exercise 3 times per week.   Exercise L: Horizontal abduction, prone  Lie on your abdomen on a firm surface so your left / right arm hangs over the edge. Hold a 5 lb weight in your hand so your palm faces toward your feet. Your arm should be straight. Squeeze your shoulder blade down toward the middle of your back. Bend your elbow so your hand moves up, until your elbow is bent to an "L" shape (90 degrees). With your elbow bent, slowly move your forearm forward and up. Raise your hand up to the height of the surface that you are lying on. Your upper arm should not move, and your elbow should stay bent. At the top of the movement, your palm should face the floor. Hold for 3 seconds. Slowly return to the starting position and relax your muscles. Repeat for a total of 10 repetitions. Repeat 2 times. Complete this exercise 3 times per week.  Exercise M: Horizontal abduction, standing  Sit on a stable chair, or stand. Secure an exercise band to a stable object in front of you so the band is at shoulder height. Hold one end of the  exercise band in each hand. Straighten your elbows and lift your hands straight in front of you, up to shoulder  height. Your palms should face down, toward the floor. Step back, away from the secured end of the exercise band, until the band stretches. Move your arms out to your sides, and keep your arms straight. Hold for 3 seconds. Slowly return to the starting position. Repeat for a total of 10 repetitions. Repeat 2 times. Complete this exercise 3 times per week.  Exercise N: Scapular retraction and elevation  Sit on a stable chair, or stand. Secure an exercise band to a stable object in front of you so the band is at shoulder height. Hold one end of the exercise band in each hand. Your palms should face each other. Sit in a stable chair without armrests, or stand. Step back, away from the secured end of the exercise band, until the band stretches. Squeeze your shoulder blades together and lift your hands over your head. Keep your elbows straight. Hold for 3 seconds. Slowly return to the starting position. Repeat for a total of 10 repetitions. Repeat 2 times. Complete this exercise 3 times per week.  This information is not intended to replace advice given to you by your health care provider. Make sure you discuss any questions you have with your health care provider. Document Released: 12/05/2005 Document Revised: 08/11/2016 Document Reviewed: 10/22/2015 Elsevier Interactive Patient Education  2017 [employer].

## 2022-08-12 ENCOUNTER — Other Ambulatory Visit: Payer: Self-pay | Admitting: Family Medicine

## 2023-01-09 ENCOUNTER — Other Ambulatory Visit (HOSPITAL_BASED_OUTPATIENT_CLINIC_OR_DEPARTMENT_OTHER): Payer: Self-pay | Admitting: Family Medicine

## 2023-01-09 ENCOUNTER — Telehealth (HOSPITAL_BASED_OUTPATIENT_CLINIC_OR_DEPARTMENT_OTHER): Payer: Self-pay

## 2023-01-09 DIAGNOSIS — Z1231 Encounter for screening mammogram for malignant neoplasm of breast: Secondary | ICD-10-CM

## 2023-01-12 ENCOUNTER — Encounter (HOSPITAL_BASED_OUTPATIENT_CLINIC_OR_DEPARTMENT_OTHER): Payer: Self-pay

## 2023-01-12 ENCOUNTER — Other Ambulatory Visit: Payer: Self-pay | Admitting: Family Medicine

## 2023-01-12 ENCOUNTER — Ambulatory Visit (HOSPITAL_BASED_OUTPATIENT_CLINIC_OR_DEPARTMENT_OTHER)
Admission: RE | Admit: 2023-01-12 | Discharge: 2023-01-12 | Disposition: A | Discharge status: Home / Self Care | Payer: Managed Care, Other (non HMO) | Source: Ambulatory Visit | Attending: Family Medicine | Admitting: Family Medicine

## 2023-01-12 ENCOUNTER — Telehealth: Payer: Self-pay | Admitting: Family Medicine

## 2023-01-12 ENCOUNTER — Ambulatory Visit: Payer: Managed Care, Other (non HMO) | Admitting: Family Medicine

## 2023-01-12 ENCOUNTER — Other Ambulatory Visit (HOSPITAL_COMMUNITY)
Admission: RE | Admit: 2023-01-12 | Discharge: 2023-01-12 | Disposition: A | Discharge status: Home / Self Care | Payer: Managed Care, Other (non HMO) | Source: Ambulatory Visit | Attending: Family Medicine | Admitting: Family Medicine

## 2023-01-12 ENCOUNTER — Encounter: Payer: Self-pay | Admitting: Family Medicine

## 2023-01-12 VITALS — BP 110/70 | HR 68 | Temp 97.1°F | Resp 18 | Ht 62.0 in | Wt 117.8 lb

## 2023-01-12 DIAGNOSIS — E039 Hypothyroidism, unspecified: Secondary | ICD-10-CM

## 2023-01-12 DIAGNOSIS — Z23 Encounter for immunization: Secondary | ICD-10-CM | POA: Diagnosis not present

## 2023-01-12 DIAGNOSIS — Z0000 Encounter for general adult medical examination without abnormal findings: Secondary | ICD-10-CM

## 2023-01-12 DIAGNOSIS — Z1231 Encounter for screening mammogram for malignant neoplasm of breast: Secondary | ICD-10-CM | POA: Diagnosis present

## 2023-01-12 DIAGNOSIS — E2839 Other primary ovarian failure: Secondary | ICD-10-CM

## 2023-01-12 LAB — COMPREHENSIVE METABOLIC PANEL
ALT: 14 U/L (ref 0–35)
AST: 14 U/L (ref 0–37)
Albumin: 4.8 g/dL (ref 3.5–5.2)
Alkaline Phosphatase: 37 U/L — ABNORMAL LOW (ref 39–117)
BUN: 17 mg/dL (ref 6–23)
CO2: 29 mEq/L (ref 19–32)
Calcium: 9.3 mg/dL (ref 8.4–10.5)
Chloride: 104 mEq/L (ref 96–112)
Creatinine, Ser: 0.64 mg/dL (ref 0.40–1.20)
GFR: 100.96 mL/min (ref >60.00)
Glucose, Bld: 95 mg/dL (ref 70–99)
Potassium: 3.9 mEq/L (ref 3.5–5.1)
Sodium: 141 mEq/L (ref 135–145)
Total Bilirubin: 0.6 mg/dL (ref 0.2–1.2)
Total Protein: 7 g/dL (ref 6.0–8.3)

## 2023-01-12 LAB — LIPID PANEL
Cholesterol: 171 mg/dL (ref 0–200)
HDL: 61.8 mg/dL (ref >39.00)
LDL Cholesterol: 94 mg/dL (ref 0–99)
NonHDL: 108.79
Total CHOL/HDL Ratio: 3
Triglycerides: 75 mg/dL (ref 0.0–149.0)
VLDL: 15 mg/dL (ref 0.0–40.0)

## 2023-01-12 LAB — CBC WITH DIFFERENTIAL/PLATELET
Basophils Absolute: 0 10*3/uL (ref 0.0–0.1)
Basophils Relative: 0.9 % (ref 0.0–3.0)
Eosinophils Absolute: 0.1 10*3/uL (ref 0.0–0.7)
Eosinophils Relative: 1.3 % (ref 0.0–5.0)
HCT: 39.8 % (ref 36.0–46.0)
Hemoglobin: 13.5 g/dL (ref 12.0–15.0)
Lymphocytes Relative: 38.7 % (ref 12.0–46.0)
Lymphs Abs: 1.8 10*3/uL (ref 0.7–4.0)
MCHC: 34 g/dL (ref 30.0–36.0)
MCV: 88.5 fl (ref 78.0–100.0)
Monocytes Absolute: 0.2 10*3/uL (ref 0.1–1.0)
Monocytes Relative: 5.2 % (ref 3.0–12.0)
Neutro Abs: 2.5 10*3/uL (ref 1.4–7.7)
Neutrophils Relative %: 53.9 % (ref 43.0–77.0)
Platelets: 243 10*3/uL (ref 150.0–400.0)
RBC: 4.5 Mil/uL (ref 3.87–5.11)
RDW: 12.7 % (ref 11.5–15.5)
WBC: 4.6 10*3/uL (ref 4.0–10.5)

## 2023-01-12 LAB — TSH: TSH: 3.19 u[IU]/mL (ref 0.35–5.50)

## 2023-01-12 MED ORDER — CALCIUM CARBONATE 1500 (600 CA) MG PO TABS: ORAL_TABLET | ORAL | 2 refills | Status: DC

## 2023-01-12 MED ORDER — FISH OIL 1000 MG PO CAPS: 2.0000 | ORAL_CAPSULE | Freq: Two times a day (BID) | ORAL | 1 refills | Status: DC

## 2023-01-12 MED ORDER — MULTIVITAMINS PO CAPS: ORAL_CAPSULE | ORAL | 1 refills | Status: DC

## 2023-01-12 NOTE — Assessment & Plan Note (Signed)
Ghm utd Check labs  See AVS  

## 2023-01-12 NOTE — Progress Notes (Signed)
Subjective:   By signing my name below, I, Shehryar Baig, attest that this documentation has been prepared under the direction and in the presence of Ann Held, DO. 01/12/2023   Patient ID: Kirsten Ramirez, female    DOB: January 20, 1971, 52 y.o.   MRN: [id number]  Chief Complaint  Patient presents with   Annual Exam    Pt states fasting     HPI Patient is in today for a comprehensive physical exam.   She does not follow up with a GYN specialist regularly.  She denies having any fever, new moles, congestion, sore throat, sinus pain, new muscle pain, new joint pain, chest pain, palpations, cough, SOB, wheezing, n/v/d, constipation, dysuria, frequency, hematuria, or headaches at this time. She has no changes to her family medical history.  She is interested in receiving the flu vaccine during this visit. She is due for a tetanus vaccine and is interested in receiving it during this visit.  She is participating in regular exercise by walking any trying to keep active throughout the day.   Mammogram was last completed 01/10/2022. Results are normal, repeat in 1 year. She has a follow up mammogram today.  Colonoscopy was last completed 01/24/2020. Results are normal. Repeat in 10 years.  Pap smear last completed 10/16/2019. Results are normal. Repeat in 3 years. She is interested in completing a pap smear during this visit.  She is UTD on vision and dental care.    Past Medical History:  Diagnosis Date   Allergy    Preventative health care 06/02/2018   Thyroid disease     Past Surgical History:  Procedure Laterality Date   APPENDECTOMY  1992   CESAREAN SECTION  2009, 2011   TUBAL LIGATION      Family History  Problem Relation Age of Onset   Hypertension Mother    Hypertension Father    Cancer Father        colon, liver   Stroke Maternal Aunt    Colon cancer Neg Hx    Esophageal cancer Neg Hx    Rectal cancer Neg Hx    Stomach cancer Neg Hx     Social History    Socioeconomic History   Marital status: Married    Spouse name: Not on file   Number of children: 2   Years of education: Not on file   Highest education level: Not on file  Occupational History   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   Drug use: No   Sexual activity: Yes    Partners: Male  Other Topics Concern   Not on file  Social History Narrative   Exercise-- walks in am   Social Determinants of Health   Financial Resource Strain: Not on file  Food Insecurity: Not on file  Transportation Needs: Not on file  Physical Activity: Not on file  Stress: Not on file  Social Connections: Not on file  Intimate Partner Violence: Not on file    Outpatient Medications Prior to Visit  Medication Sig Dispense Refill   levothyroxine (SYNTHROID) 25 MCG tablet Take 1 tablet (25 mcg total) by mouth daily before breakfast. 90 tablet 1   NONFORMULARY OR COMPOUNDED ITEM Women's One a Day  Multi vitamin for 50+ 90 each 3   tiZANidine (ZANAFLEX) 4 MG tablet Take 1 tablet (4 mg total) by mouth every 6 (six) hours as needed for muscle spasms. (Patient not taking: Reported on 01/12/2023) 30 tablet 0   No facility-administered medications prior to visit.    No Known Allergies  Review of Systems  Constitutional:  Negative for fever and malaise/fatigue.  HENT:  Negative for congestion, sinus pain and sore throat.   Eyes:  Negative for blurred vision.  Respiratory:  Negative for cough, shortness of breath and wheezing.   Cardiovascular:  Negative for chest pain, palpitations and leg swelling.  Gastrointestinal:  Negative for abdominal pain, blood in stool, constipation, diarrhea, nausea and vomiting.  Genitourinary:  Negative for dysuria, frequency and hematuria.   Musculoskeletal:  Negative for falls.       (-)new muscle pain (-)new joint pain  Skin:  Negative for rash.       (-)New moles  Neurological:  Negative for dizziness, loss of consciousness and headaches.  Endo/Heme/Allergies:  Negative for environmental allergies.  Psychiatric/Behavioral:  Negative for depression. The patient is not nervous/anxious.        Objective:    Physical Exam Vitals and nursing note reviewed.  Constitutional:      General: She is not in acute distress.    Appearance: Normal appearance. She is not ill-appearing.  HENT:     Head: Normocephalic and atraumatic.     Right Ear: Tympanic membrane, ear canal and external ear normal.     Left Ear: Tympanic membrane, ear canal and external ear normal.  Eyes:     Extraocular Movements: Extraocular movements intact.     Pupils: Pupils are equal, round, and reactive to light.  Cardiovascular:     Rate and Rhythm: Normal rate and regular rhythm.     Heart sounds: Normal heart sounds. No murmur heard.    No gallop.  Pulmonary:     Effort: Pulmonary effort is normal. No respiratory distress.     Breath sounds: Normal breath sounds. No wheezing or rales.  Chest:  Breasts:    Right: Normal.     Left: Normal.  Abdominal:     General: Bowel sounds are normal. There is no distension.     Palpations: Abdomen is soft.     Tenderness: There is no abdominal tenderness. There is no guarding.  Genitourinary:    Vagina: Normal.     Cervix: Normal.     Rectum: Guaiac result negative.  Skin:    General: Skin is warm and dry.  Neurological:     Mental Status: She is alert and oriented to person, place, and time.  Psychiatric:        Judgment: Judgment normal.     BP 110/70 (BP Location: Right Arm, Patient Position: Sitting, Cuff Size: Normal)   Pulse 68   Temp (!) 97.1 F (36.2 C) (Oral)   Resp 18   Ht 5\' 2"  (1.575 m)   Wt 117 lb 12.8 oz (53.4 kg)   SpO2 97%   BMI 21.55 kg/m  Wt Readings from Last 3  Encounters:  01/12/23 117 lb 12.8 oz (53.4 kg)  08/09/22 119 lb (54 kg)  06/03/22 115 lb 12.8 oz (52.5 kg)       Assessment & Plan:  Preventative health care Assessment & Plan: Ghm utd Check labs  See AVS   Orders: -     CBC with Differential/Platelet -     Comprehensive metabolic panel -  Lipid panel -     TSH -     Cytology - PAP  Need for influenza vaccination -     Flu Vaccine QUAD 53mo+IM (Fluarix, Fluzone & Alfiuria Quad PF)  Need for tetanus booster -     Tdap vaccine greater than or equal to 7yo IM  Hypothyroidism, unspecified type Assessment & Plan: Check labs  Con't synthroid  stable     I, Donato Schultz, DO, personally preformed the services described in this documentation.  All medical record entries made by the scribe were at my direction and in my presence.  I have reviewed the chart and discharge instructions (if applicable) and agree that the record reflects my personal performance and is accurate and complete. 01/12/2023   I,Shehryar Baig,acting as a scribe for Donato Schultz, DO.,have documented all relevant documentation on the behalf of Donato Schultz, DO,as directed by  Donato Schultz, DO while in the presence of Donato Schultz, DO.   Donato Schultz, DO

## 2023-01-12 NOTE — Assessment & Plan Note (Signed)
Check labs  Con't synthroid  stable

## 2023-01-12 NOTE — Telephone Encounter (Signed)
Pt was seen today and forgot to ask if provider can write a rx for Daily Multi vitamins, Maximum Citracal calcium supplement, Fish Oil 1000 mg and Mature multi vitamins so the pt's FSA can give  pt refund for her amount spent. Please advice.

## 2023-01-16 ENCOUNTER — Encounter: Payer: Self-pay | Admitting: Family Medicine

## 2023-01-18 LAB — CYTOLOGY - PAP
Comment: NEGATIVE
Diagnosis: NEGATIVE
High risk HPV: NEGATIVE

## 2023-02-08 ENCOUNTER — Other Ambulatory Visit: Payer: Self-pay | Admitting: Family Medicine

## 2023-02-11 ENCOUNTER — Other Ambulatory Visit: Payer: Self-pay | Admitting: Family Medicine

## 2023-02-11 DIAGNOSIS — E039 Hypothyroidism, unspecified: Secondary | ICD-10-CM

## 2023-04-07 LAB — AMB RESULTS CONSOLE CBG: Glucose: 121

## 2023-04-07 NOTE — Progress Notes (Signed)
Patient provided minimal information, only wanted to be screened.

## 2023-04-27 ENCOUNTER — Encounter: Payer: Self-pay | Admitting: *Deleted

## 2023-04-27 NOTE — Progress Notes (Signed)
Pt attended 04/07/23 event where screening results were wnl and no SDOH insecurities were identified. Pt has PCP, Dr. Seabron Spates at [facility], whom she last saw on 01/11/23 and with whom she has a future appt on 01/12/24. No additional health equity team support indicated at this time.

## 2023-11-01 ENCOUNTER — Telehealth: Payer: Self-pay | Admitting: Family Medicine

## 2023-11-01 NOTE — Telephone Encounter (Signed)
Attempted to call patient to verify pharmacy since it is not in her chart. No answer and vm not set up

## 2023-11-01 NOTE — Telephone Encounter (Signed)
Makayla from [facility] called and stated that they faxed over a request for levothyroxine for the patient three times and has not heard back. Please call and advise at [phone].

## 2024-01-15 ENCOUNTER — Encounter: Payer: Managed Care, Other (non HMO) | Admitting: Family Medicine

## 2024-02-01 ENCOUNTER — Other Ambulatory Visit: Payer: Self-pay | Admitting: Family Medicine

## 2024-02-01 ENCOUNTER — Encounter: Payer: Self-pay | Admitting: Family Medicine

## 2024-02-01 DIAGNOSIS — E039 Hypothyroidism, unspecified: Secondary | ICD-10-CM

## 2024-03-14 ENCOUNTER — Other Ambulatory Visit (HOSPITAL_BASED_OUTPATIENT_CLINIC_OR_DEPARTMENT_OTHER): Payer: Self-pay | Admitting: Family Medicine

## 2024-03-14 ENCOUNTER — Encounter: Payer: Self-pay | Admitting: Family Medicine

## 2024-03-14 DIAGNOSIS — Z1231 Encounter for screening mammogram for malignant neoplasm of breast: Secondary | ICD-10-CM

## 2024-03-14 DIAGNOSIS — Z139 Encounter for screening, unspecified: Secondary | ICD-10-CM

## 2024-03-21 ENCOUNTER — Inpatient Hospital Stay (HOSPITAL_BASED_OUTPATIENT_CLINIC_OR_DEPARTMENT_OTHER): Admission: RE | Admit: 2024-03-21 | Source: Ambulatory Visit

## 2024-05-09 ENCOUNTER — Ambulatory Visit: Payer: Self-pay

## 2024-05-09 NOTE — Telephone Encounter (Signed)
 Copied from CRM [id number]. Topic: Clinical - Red Word Triage >> May 09, 2024  5:04 PM Chuck Crater wrote: Red Word that prompted transfer to Nurse Triage: Patient stated that her son and daughter has been feeling bad and now she feels the same way. Symptoms: sore throat, headache, body ache, dizzy, and light headed.   Chief Complaint: Cough Symptoms: Cough, sore throat, headache, body aches, congestion  Frequency: Frequent  Pertinent Negatives: Patient denies fever Disposition: [] ED /[] Urgent Care (no appt availability in office) / [] Appointment(In office/virtual)/ []  [facility]/ [x] Home Care/ [] Refused Recommended Disposition /[] [facility]/ []  Follow-up with PCP Additional Notes: Patient calling to report that her kids have been sick recently and now she is experiencing symptoms as well. She states that she has been experiencing a cough, sore throat, headache, body aches, and sinus congestion for the last couple of days. She denies any fevers. Patient declined an appointment and states she will call back if she feels the need to be seen.   [employer] is requesting a work note. I advised that she needs to be seen in the office in order to get a work note but she has asked that I send the request without an appointment. Patient would like a call with a response to this request when able.    Reason for Disposition  Cough with cold symptoms (e.g., runny nose, postnasal drip, throat clearing)  Answer Assessment - Initial Assessment Questions 1. ONSET: "When did the cough begin?"      A couple of days  2. SEVERITY: "How bad is the cough today?"      Mild  3. SPUTUM: "Describe the color of your sputum" (none, dry cough; clear, white, yellow, green)     None 4. HEMOPTYSIS: "Are you coughing up any blood?" If so ask: "How much?" (flecks, streaks, tablespoons, etc.)     No 5. DIFFICULTY BREATHING: "Are you having difficulty breathing?" If Yes, ask: "How bad is it?" (e.g., mild,  moderate, severe)    - MILD: No SOB at rest, mild SOB with walking, speaks normally in sentences, can lie down, no retractions, pulse < 100.    - MODERATE: SOB at rest, SOB with minimal exertion and prefers to sit, cannot lie down flat, speaks in phrases, mild retractions, audible wheezing, pulse 100-120.    - SEVERE: Very SOB at rest, speaks in single words, struggling to breathe, sitting hunched forward, retractions, pulse > 120      No 6. FEVER: "Do you have a fever?" If Yes, ask: "What is your temperature, how was it measured, and when did it start?"     No 7. CARDIAC HISTORY: "Do you have any history of heart disease?" (e.g., heart attack, congestive heart failure)      No 8. LUNG HISTORY: "Do you have any history of lung disease?"  (e.g., pulmonary embolus, asthma, emphysema)     No 9. PE RISK FACTORS: "Do you have a history of blood clots?" (or: recent major surgery, recent prolonged travel, bedridden)     No 10. OTHER SYMPTOMS: "Do you have any other symptoms?" (e.g., runny nose, wheezing, chest pain)       Headache, body aches, sore throat, nasal congestion  Protocols used: Cough - Acute Non-Productive-A-AH

## 2024-05-10 NOTE — Telephone Encounter (Signed)
 Called pt back LVM trying to schedule her for appt today with Dr.Lowne at 4. Ask her to call our office back if she still needs to  be seen.

## 2024-05-14 ENCOUNTER — Encounter: Payer: Self-pay | Admitting: Family Medicine

## 2024-05-27 ENCOUNTER — Other Ambulatory Visit: Payer: Self-pay | Admitting: *Deleted

## 2024-05-27 DIAGNOSIS — Z1231 Encounter for screening mammogram for malignant neoplasm of breast: Secondary | ICD-10-CM

## 2024-05-28 DIAGNOSIS — Z1231 Encounter for screening mammogram for malignant neoplasm of breast: Secondary | ICD-10-CM | POA: Diagnosis not present

## 2024-05-28 LAB — HM MAMMOGRAPHY

## 2024-05-30 ENCOUNTER — Ambulatory Visit (INDEPENDENT_AMBULATORY_CARE_PROVIDER_SITE_OTHER): Payer: Self-pay | Admitting: Family Medicine

## 2024-05-30 VITALS — BP 102/70 | HR 71 | Temp 98.3°F | Resp 16 | Ht 62.0 in | Wt 116.4 lb

## 2024-05-30 DIAGNOSIS — Z0000 Encounter for general adult medical examination without abnormal findings: Secondary | ICD-10-CM

## 2024-05-30 DIAGNOSIS — E039 Hypothyroidism, unspecified: Secondary | ICD-10-CM | POA: Diagnosis not present

## 2024-05-30 NOTE — Progress Notes (Signed)
 Established Patient Office Visit  Subjective   Patient ID: Kirsten Ramirez, female    DOB: 31-Dec-1970  Age: 53 y.o. MRN: [id number]  Chief Complaint  Patient presents with   Annual Exam    Pt states not fasting     HPI Discussed the use of AI scribe software for clinical note transcription with the patient, who gave verbal consent to proceed.  History of Present Illness   The patient presents for an annual physical examination and routine blood work. She is accompanied by her son, Zoila Hines.  The patient has recently changed her insurance company and is concerned about coverage for blood work, particularly thyroid  testing, due to a high deductible. She has confirmed that the lab work can be sent to [facility] to ensure coverage. She mentions a previous issue where a TSH test was not covered under her physical exam, leading to out-of-pocket expenses.  She has a mammogram scheduled for Tuesday at [facility] and has been attending regular eye doctor appointments every six months.  She reports no new moles or skin changes and expresses concern about having 'too much fat' in her stomach area. Her joints are in good condition, and she has no stomach issues.  She is busy with activities such as driving her children, including taking her son to figure skating.      Patient Active Problem List   Diagnosis Date Noted   Enlarged thyroid  06/03/2022   infected abscess 09/10/2019   Preventative health care 06/02/2018   Pelvic pain in female 09/25/2015   Wellness examination 01/02/2015   Hypothyroidism 12/29/2014   Urticaria 12/29/2014   Past Medical History:  Diagnosis Date   Allergy    Preventative health care 06/02/2018   Thyroid  disease    Past Surgical History:  Procedure Laterality Date   APPENDECTOMY  1992   CESAREAN SECTION  2009, 2011   TUBAL LIGATION     Social History   Tobacco Use   Smoking status: Never   Smokeless tobacco: Never  Substance Use Topics   Alcohol use:  Yes    Alcohol/week: 0.0 standard drinks of alcohol    Comment: only very rare and very little around holidays.   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   Drug use: No   Sexual activity: Yes    Partners: Male  Other Topics Concern   Not on file  Social History Narrative   Exercise-- walks in am   Social Drivers of Health   Financial Resource Strain: Low Risk  (05/30/2024)   Overall Financial Resource Strain (CARDIA)    Difficulty of Paying Living Expenses: Not very hard  Food Insecurity: No Food Insecurity (05/30/2024)   Hunger Vital Sign    Worried About Running Out of Food in the Last Year: Never true    Ran Out of Food in the Last Year: Never true  Transportation Needs: No Transportation  Needs (05/30/2024)   PRAPARE - Administrator, Civil Service (Medical): No    Lack of Transportation (Non-Medical): No  Physical Activity: Insufficiently Active (05/30/2024)   Exercise Vital Sign    Days of Exercise per Week: 2 days    Minutes of Exercise per Session: 30 min  Stress: No Stress Concern Present (05/30/2024)   [employer] of Occupational Health - Occupational Stress Questionnaire    Feeling of Stress: Not at all  Social Connections: Socially Integrated (05/30/2024)   Social Connection and Isolation Panel    Frequency of Communication with Friends and Family: More than three times a week    Frequency of Social Gatherings with Friends and Family: More than three times a  week    Attends Religious Services: More than 4 times per year    Active Member of [employer] or Organizations: Yes    Attends Banker Meetings: More than 4 times per year    Marital Status: Married  Catering manager Violence: Not At Risk (04/07/2023)   Humiliation, Afraid, Rape, and Kick questionnaire    Fear of Current or Ex-Partner: No    Emotionally Abused: No    Physically Abused: No    Sexually Abused: No   Family Status  Relation Name Status   Mother  Alive   Father  Alive   Mat Aunt  Deceased at age 72   Mat Uncle  Deceased at age 48   Neg Hx  (Not Specified)  No partnership data on file   Family History  Problem Relation Age of Onset   Hypertension Mother    Hypertension Father    Cancer Father        colon, liver   Stroke Maternal Aunt    Colon cancer Neg Hx    Esophageal cancer Neg Hx    Rectal cancer Neg Hx    Stomach cancer Neg Hx    No Known Allergies    ROS    Objective:     BP 102/70 (BP Location: Left Arm, Patient Position: Sitting, Cuff Size: Normal)   Pulse 71   Temp 98.3 F (36.8 C) (Oral)   Resp 16   Ht 5' 2 (1.575 m)   Wt 116 lb 6.4 oz (52.8 kg)   LMP 04/06/2019   SpO2 96%   BMI 21.29 kg/m  BP Readings from Last 3 Encounters:  05/30/24 102/70  04/07/23 109/64  01/12/23 110/70   Wt Readings from Last 3 Encounters:  05/30/24 116 lb 6.4 oz (52.8 kg)  01/12/23 117 lb 12.8 oz (53.4 kg)  08/09/22 119 lb (54 kg)   SpO2 Readings from Last 3 Encounters:  05/30/24 96%  01/12/23 97%  08/09/22 98%      Physical Exam   Results for orders placed or performed in visit on 05/30/24  CBC with Differential/Platelet  Result Value Ref Range   WBC 5.6 3.8 - 10.8 Thousand/uL   RBC 4.28 3.80 - 5.10 Million/uL   Hemoglobin 12.8 11.7 - 15.5 g/dL   HCT 16.1 09.6 - 04.5 %   MCV 90.0 80.0 - 100.0 fL   MCH 29.9 27.0 - 33.0 pg   MCHC 33.2 32.0 - 36.0 g/dL   RDW 40.9 81.1 - 91.4 %   Platelets 243 140 - 400 Thousand/uL   MPV 10.8 7.5 -  12.5 fL   Neutro Abs 3,086 1,500 - 7,800 cells/uL   Absolute Lymphocytes 2,089 850 - 3,900 cells/uL   Absolute Monocytes 308 200 -  950 cells/uL   Eosinophils Absolute 78 15 - 500 cells/uL   Basophils Absolute 39 0 - 200 cells/uL   Neutrophils Relative % 55.1 %   Total Lymphocyte 37.3 %   Monocytes Relative 5.5 %   Eosinophils Relative 1.4 %   Basophils Relative 0.7 %  Comprehensive metabolic panel with GFR  Result Value Ref Range   Glucose, Bld 122 (H) 65 - 99 mg/dL   BUN 15 7 - 25 mg/dL   Creat 8.29 5.62 - 1.30 mg/dL   eGFR 84 > OR = 60 QM/VHQ/4.69G2   BUN/Creatinine Ratio SEE NOTE: 6 - 22 (calc)   Sodium 141 135 - 146 mmol/L   Potassium 3.7 3.5 - 5.3 mmol/L   Chloride 107 98 - 110 mmol/L   CO2 24 20 - 32 mmol/L   Calcium  9.5 8.6 - 10.4 mg/dL   Total Protein 6.9 6.1 - 8.1 g/dL   Albumin 4.6 3.6 - 5.1 g/dL   Globulin 2.3 1.9 - 3.7 g/dL (calc)   AG Ratio 2.0 1.0 - 2.5 (calc)   Total Bilirubin 0.9 0.2 - 1.2 mg/dL   Alkaline phosphatase (APISO) 54 37 - 153 U/L   AST 14 10 - 35 U/L   ALT 15 6 - 29 U/L  Lipid panel  Result Value Ref Range   Cholesterol 174 <200 mg/dL   HDL 60 > OR = 50 mg/dL   Triglycerides 952 (H) <150 mg/dL   LDL Cholesterol (Calc) 89 mg/dL (calc)   Total CHOL/HDL Ratio 2.9 <5.0 (calc)   Non-HDL Cholesterol (Calc) 114 <130 mg/dL (calc)  TSH  Result Value Ref Range   TSH 2.39 mIU/L    Last CBC Lab Results  Component Value Date   WBC 5.6 05/30/2024   HGB 12.8 05/30/2024   HCT 38.5 05/30/2024   MCV 90.0 05/30/2024   MCH 29.9 05/30/2024   RDW 11.9 05/30/2024   PLT 243 05/30/2024   Last metabolic panel Lab Results  Component Value Date   GLUCOSE 122 (H) 05/30/2024   NA 141 05/30/2024   K 3.7 05/30/2024   CL 107 05/30/2024   CO2 24 05/30/2024   BUN 15 05/30/2024   CREATININE 0.83 05/30/2024   EGFR 84 05/30/2024   CALCIUM  9.5 05/30/2024   PROT 6.9 05/30/2024   ALBUMIN 4.8 01/12/2023   BILITOT 0.9 05/30/2024   ALKPHOS 37 (L) 01/12/2023    AST 14 05/30/2024   ALT 15 05/30/2024   Last lipids Lab Results  Component Value Date   CHOL 174 05/30/2024   HDL 60 05/30/2024   LDLCALC 89 05/30/2024   TRIG 157 (H) 05/30/2024   CHOLHDL 2.9 05/30/2024   Last hemoglobin A1c No results found for: HGBA1C Last thyroid  functions Lab Results  Component Value Date   TSH 2.39 05/30/2024   T4TOTAL 5.8 06/03/2022   Last vitamin D  Lab Results  Component Value Date   VD25OH 26.75 (L) 03/04/2020   Last vitamin B12 and Folate Lab Results  Component Value Date   VITAMINB12 >1500 (H) 03/04/2020      The 10-year ASCVD risk score (Arnett DK, et al., 2019) is: 0.9%    Assessment & Plan:   Problem List Items Addressed This Visit       Unprioritized   Hypothyroidism   Preventative health care - Primary   Ghm utd Check labs  See AVS Health Maintenance  Topic Date Due   HIV Screening  Never done   COVID-19 Vaccine (3 - 2024-25 season) 08/20/2023  INFLUENZA VACCINE  07/19/2024   MAMMOGRAM  05/28/2025   Cervical Cancer Screening (HPV/Pap Cotest)  01/13/2028   Colonoscopy  01/23/2030   DTaP/Tdap/Td (3 - Td or Tdap) 01/12/2033   Hepatitis C Screening  Completed   Zoster Vaccines- Shingrix   Completed   HPV VACCINES  Aged Out   Meningococcal B Vaccine  Aged Out         Relevant Orders   CBC with Differential/Platelet (Completed)   Comprehensive metabolic panel with GFR (Completed)   Lipid panel (Completed)   TSH (Completed)  Assessment and Plan    General Health Maintenance   A routine physical examination was completed. Insurance coverage for lab work and a mammogram was discussed. A mammogram is scheduled for Tuesday at [facility]. Regular eye examinations occur every six months. There are no changes in family history, moles, or skin condition. Joints and gastrointestinal system are in good condition. Blood work will be sent to [facility] for [employer] and coded under physical examination to  avoid additional charges due to a high deductible. Continue regular eye examinations every six months.        Return in about 1 year (around 05/30/2025), or if symptoms worsen or fail to improve, for fasting, annual exam.    Estill Hemming, DO

## 2024-05-31 ENCOUNTER — Encounter: Payer: Self-pay | Admitting: Family Medicine

## 2024-05-31 LAB — CBC WITH DIFFERENTIAL/PLATELET
Absolute Lymphocytes: 2089 {cells}/uL (ref 850–3900)
Absolute Monocytes: 308 {cells}/uL (ref 200–950)
Basophils Absolute: 39 {cells}/uL (ref 0–200)
Basophils Relative: 0.7 %
Eosinophils Absolute: 78 {cells}/uL (ref 15–500)
Eosinophils Relative: 1.4 %
HCT: 38.5 % (ref 35.0–45.0)
Hemoglobin: 12.8 g/dL (ref 11.7–15.5)
MCH: 29.9 pg (ref 27.0–33.0)
MCHC: 33.2 g/dL (ref 32.0–36.0)
MCV: 90 fL (ref 80.0–100.0)
MPV: 10.8 fL (ref 7.5–12.5)
Monocytes Relative: 5.5 %
Neutro Abs: 3086 {cells}/uL (ref 1500–7800)
Neutrophils Relative %: 55.1 %
Platelets: 243 10*3/uL (ref 140–400)
RBC: 4.28 10*6/uL (ref 3.80–5.10)
RDW: 11.9 % (ref 11.0–15.0)
Total Lymphocyte: 37.3 %
WBC: 5.6 10*3/uL (ref 3.8–10.8)

## 2024-05-31 LAB — COMPREHENSIVE METABOLIC PANEL WITH GFR
AG Ratio: 2 (calc) (ref 1.0–2.5)
ALT: 15 U/L (ref 6–29)
AST: 14 U/L (ref 10–35)
Albumin: 4.6 g/dL (ref 3.6–5.1)
Alkaline phosphatase (APISO): 54 U/L (ref 37–153)
BUN: 15 mg/dL (ref 7–25)
CO2: 24 mmol/L (ref 20–32)
Calcium: 9.5 mg/dL (ref 8.6–10.4)
Chloride: 107 mmol/L (ref 98–110)
Creat: 0.83 mg/dL (ref 0.50–1.03)
Globulin: 2.3 g/dL (ref 1.9–3.7)
Glucose, Bld: 122 mg/dL — ABNORMAL HIGH (ref 65–99)
Potassium: 3.7 mmol/L (ref 3.5–5.3)
Sodium: 141 mmol/L (ref 135–146)
Total Bilirubin: 0.9 mg/dL (ref 0.2–1.2)
Total Protein: 6.9 g/dL (ref 6.1–8.1)
eGFR: 84 mL/min/{1.73_m2} (ref >=60)

## 2024-05-31 LAB — LIPID PANEL
Cholesterol: 174 mg/dL (ref <200)
HDL: 60 mg/dL (ref >=50)
LDL Cholesterol (Calc): 89 mg/dL
Non-HDL Cholesterol (Calc): 114 mg/dL (ref <130)
Total CHOL/HDL Ratio: 2.9 (calc) (ref <5.0)
Triglycerides: 157 mg/dL — ABNORMAL HIGH (ref <150)

## 2024-05-31 LAB — TSH: TSH: 2.39 m[IU]/L

## 2024-05-31 NOTE — Patient Instructions (Signed)
 Preventive Care 16-55 Years Old, Female  Preventive care refers to lifestyle choices and visits with your health care provider that can promote health and wellness. Preventive care visits are also called wellness exams.  What can I expect for my preventive care visit?  Counseling  Your health care provider may ask you questions about your:  Medical history, including:  Past medical problems.  Family medical history.  Pregnancy history.  Current health, including:  Menstrual cycle.  Method of birth control.  Emotional well-being.  Home life and relationship well-being.  Sexual activity and sexual health.  Lifestyle, including:  Alcohol, nicotine or tobacco, and drug use.  Access to firearms.  Diet, exercise, and sleep habits.  Work and work Astronomer.  Sunscreen use.  Safety issues such as seatbelt and bike helmet use.  Physical exam  Your health care provider will check your:  Height and weight. These may be used to calculate your BMI (body mass index). BMI is a measurement that tells if you are at a healthy weight.  Waist circumference. This measures the distance around your waistline. This measurement also tells if you are at a healthy weight and may help predict your risk of certain diseases, such as type 2 diabetes and high blood pressure.  Heart rate and blood pressure.  Body temperature.  Skin for abnormal spots.  What immunizations do I need?    Vaccines are usually given at various ages, according to a schedule. Your health care provider will recommend vaccines for you based on your age, medical history, and lifestyle or other factors, such as travel or where you work.  What tests do I need?  Screening  Your health care provider may recommend screening tests for certain conditions. This may include:  Lipid and cholesterol levels.  Diabetes screening. This is done by checking your blood sugar (glucose) after you have not eaten for a while (fasting).  Pelvic exam and Pap test.  Hepatitis B test.  Hepatitis C  test.  HIV (human immunodeficiency virus) test.  STI (sexually transmitted infection) testing, if you are at risk.  Lung cancer screening.  Colorectal cancer screening.  Mammogram. Talk with your health care provider about when you should start having regular mammograms. This may depend on whether you have a family history of breast cancer.  BRCA-related cancer screening. This may be done if you have a family history of breast, ovarian, tubal, or peritoneal cancers.  Bone density scan. This is done to screen for osteoporosis.  Talk with your health care provider about your test results, treatment options, and if necessary, the need for more tests.  Follow these instructions at home:  Eating and drinking    Eat a diet that includes fresh fruits and vegetables, whole grains, lean protein, and low-fat dairy products.  Take vitamin and mineral supplements as recommended by your health care provider.  Do not drink alcohol if:  Your health care provider tells you not to drink.  You are pregnant, may be pregnant, or are planning to become pregnant.  If you drink alcohol:  Limit how much you have to 0-1 drink a day.  Know how much alcohol is in your drink. In the [place] one drink equals one 12 oz bottle of beer (355 mL), one 5 oz glass of wine (148 mL), or one 1 oz glass of hard liquor (44 mL).  Lifestyle  Brush your teeth every morning and night with fluoride toothpaste. Floss one time each day.  Exercise for at least  30 minutes 5 or more days each week.  Do not use any products that contain nicotine or tobacco. These products include cigarettes, chewing tobacco, and vaping devices, such as e-cigarettes. If you need help quitting, ask your health care provider.  Do not use drugs.  If you are sexually active, practice safe sex. Use a condom or other form of protection to prevent STIs.  If you do not wish to become pregnant, use a form of birth control. If you plan to become pregnant, see your health care provider for a  prepregnancy visit.  Take aspirin only as told by your health care provider. Make sure that you understand how much to take and what form to take. Work with your health care provider to find out whether it is safe and beneficial for you to take aspirin daily.  Find healthy ways to manage stress, such as:  Meditation, yoga, or listening to music.  Journaling.  Talking to a trusted person.  Spending time with friends and family.  Minimize exposure to UV radiation to reduce your risk of skin cancer.  Safety  Always wear your seat belt while driving or riding in a vehicle.  Do not drive:  If you have been drinking alcohol. Do not ride with someone who has been drinking.  When you are tired or distracted.  While texting.  If you have been using any mind-altering substances or drugs.  Wear a helmet and other protective equipment during sports activities.  If you have firearms in your house, make sure you follow all gun safety procedures.  Seek help if you have been physically or sexually abused.  What's next?  Visit your health care provider once a year for an annual wellness visit.  Ask your health care provider how often you should have your eyes and teeth checked.  Stay up to date on all vaccines.  This information is not intended to replace advice given to you by your health care provider. Make sure you discuss any questions you have with your health care provider.  Document Revised: 06/02/2021 Document Reviewed: 06/02/2021  Elsevier Patient Education  2024 [employer].

## 2024-05-31 NOTE — Assessment & Plan Note (Signed)
 Ghm utd Check labs  See AVS Health Maintenance  Topic Date Due   HIV Screening  Never done   COVID-19 Vaccine (3 - 2024-25 season) 08/20/2023   INFLUENZA VACCINE  07/19/2024   MAMMOGRAM  05/28/2025   Cervical Cancer Screening (HPV/Pap Cotest)  01/13/2028   Colonoscopy  01/23/2030   DTaP/Tdap/Td (3 - Td or Tdap) 01/12/2033   Hepatitis C Screening  Completed   Zoster Vaccines- Shingrix   Completed   HPV VACCINES  Aged Out   Meningococcal B Vaccine  Aged Out

## 2024-07-02 ENCOUNTER — Telehealth: Payer: Self-pay

## 2024-07-02 ENCOUNTER — Encounter: Payer: Self-pay | Admitting: Family Medicine

## 2024-07-02 DIAGNOSIS — R739 Hyperglycemia, unspecified: Secondary | ICD-10-CM

## 2024-07-02 DIAGNOSIS — R928 Other abnormal and inconclusive findings on diagnostic imaging of breast: Secondary | ICD-10-CM

## 2024-07-02 NOTE — Telephone Encounter (Signed)
 A1c, Dx hyperglycemia

## 2024-07-02 NOTE — Telephone Encounter (Signed)
 A1c ordered.

## 2024-07-02 NOTE — Addendum Note (Signed)
 Addended by: Malina Geers D on: 07/02/2024 03:41 PM   Modules accepted: Orders

## 2024-07-02 NOTE — Telephone Encounter (Signed)
 Copied from CRM [id number]. Topic: Clinical - Request for Lab/Test Order >> Jul 02, 2024  2:28 PM Mia F wrote: Reason for CRM: [facility] calling stating to had a mammogram done and needed additional imaging. They have requested an order for a right diagnostic mammo and US . Please send order to [phone]

## 2024-07-04 NOTE — Addendum Note (Signed)
 Addended by: Blondie Riggsbee D on: 07/04/2024 08:37 AM   Modules accepted: Orders

## 2024-07-04 NOTE — Telephone Encounter (Signed)
 Orders faxed

## 2024-08-13 ENCOUNTER — Encounter: Payer: Self-pay | Admitting: Family Medicine

## 2024-08-27 ENCOUNTER — Other Ambulatory Visit (INDEPENDENT_AMBULATORY_CARE_PROVIDER_SITE_OTHER)

## 2024-08-27 DIAGNOSIS — R739 Hyperglycemia, unspecified: Secondary | ICD-10-CM

## 2024-08-27 NOTE — Addendum Note (Signed)
 Addended by: TRUDY CURVIN RAMAN on: 08/27/2024 10:33 AM   Modules accepted: Orders

## 2024-08-28 LAB — HEMOGLOBIN A1C
Hgb A1c MFr Bld: 5.7 % — ABNORMAL HIGH (ref <5.7)
Mean Plasma Glucose: 117 mg/dL
eAG (mmol/L): 6.5 mmol/L

## 2024-09-04 ENCOUNTER — Ambulatory Visit: Payer: Self-pay | Admitting: Family Medicine

## 2024-09-09 ENCOUNTER — Encounter: Payer: Self-pay | Admitting: Family Medicine

## 2024-10-01 ENCOUNTER — Ambulatory Visit: Payer: Self-pay

## 2024-10-01 NOTE — Telephone Encounter (Signed)
 FYI Only or Action Required?: FYI only for provider.  Patient was last seen in primary care on 05/30/2024 by Antonio Meth, Jamee SAUNDERS, DO.  Called Nurse Triage reporting Leg Pain.  Symptoms began several days ago.  Interventions attempted: OTC medications: ibuprofen and Ice/heat application.  Symptoms are: gradually improving.  Triage Disposition: See PCP When Office is Open (Within 3 Days)  Patient/caregiver understands and will follow disposition?: Yes  Copied from CRM #[id number]. Topic: Clinical - Red Word Triage >> Oct 01, 2024  3:41 PM Harlene ORN wrote: Red Word that prompted transfer to Nurse Triage: Was playing sports Saturday and felt pain in her left calf area, says it felt like someone hit her. Thought it was a cramp, but it's still in pain today.  Reason for Disposition  [1] MODERATE pain (e.g., interferes with normal activities, limping) AND [2] present > 3 days  Answer Assessment - Initial Assessment Questions Pt wondering if she needs X-ray of leg during appt and if it can be done same day. Called [facility] and spoke with Kaylee, reports there is an imaging department below clinic where pts can typically get X-rays done the same day.  1. ONSET: When did the pain start?      Saturday  2. LOCATION: Where is the pain located?      Left calf  3. PAIN: How bad is the pain?    (Scale 1-10; or mild, moderate, severe)     Cramping 9/10 pain initially, has subsides since then. Currently no pain at rest, gets up to 6/10 when walking. Radiates up leg.  4. WORK OR EXERCISE: Has there been any recent work or exercise that involved this part of the body?      Was playing pickle ball on Saturday  5. CAUSE: What do you think is causing the leg pain?     Unsure  6. OTHER SYMPTOMS: Do you have any other symptoms? (e.g., chest pain, back pain, breathing difficulty, swelling, rash, fever, numbness, weakness)     Swelling on Saturday, has resolved since then. No numbness or  tingling. No redness. Mildly cooler than other leg. No CP or SOB.  Protocols used: Leg Pain-A-AH

## 2024-10-01 NOTE — Telephone Encounter (Signed)
 Appt scheduled

## 2024-10-02 ENCOUNTER — Telehealth: Payer: Self-pay

## 2024-10-02 DIAGNOSIS — S86112A Strain of other muscle(s) and tendon(s) of posterior muscle group at lower leg level, left leg, initial encounter: Secondary | ICD-10-CM | POA: Diagnosis not present

## 2024-10-02 NOTE — Telephone Encounter (Signed)
 I called her back, explained that while [facility] can [facility] this means I will not likely have the report back by the time she is seen tomorrow.  It sounds like her insurance only covers [facility] imaging in this area.  However, from her description it sounds like this may be more of a calf tear.  I asked her if she would be comfortable waiting until I see her tomorrow and then we can decide what imaging she needs and she states agreement.  If she gets worse she will go to urgent care or the ER

## 2024-10-02 NOTE — Telephone Encounter (Signed)
 Copied from CRM #[id number]. Topic: General - Other >> Oct 02, 2024 10:42 AM Berneda FALCON wrote: Reason for CRM: Patient returning phone call and wanted the PCP to know that the pain is on her calf between her knee and ankle. Closer to the knee on the back side of her left leg.  Not behind the knee, but the calf area. This is in between the tibia and fibula that is hurting (in the muscle).  Would like a CPT Code for this Xray order as well if possible and states that she would [facility].  Patient callback is [phone] or home [phone]

## 2024-10-02 NOTE — Telephone Encounter (Signed)
 Pt is seeing you tomorrow. There is a triage note. Please advise

## 2024-10-02 NOTE — Telephone Encounter (Signed)
 Called pt was not able to leave a NV due to VM not being set up. Pt did call back to let us  know the location of the leg pain, message has been forwarded to Dr. Watt as she will be ordering imaging for pt.

## 2024-10-02 NOTE — Telephone Encounter (Signed)
 Copied from CRM [id number]. Topic: General - Other >> Oct 02, 2024  9:07 AM [name] wrote: Reason for CRM: Patient called in wanted to know if she could get an xray before the appointment , would like a callback if she could get that today for her appointment tomorrow . Would like a callback

## 2024-10-03 ENCOUNTER — Ambulatory Visit: Admitting: Family Medicine
